# Patient Record
Sex: Female | Born: 1965 | Race: White | Hispanic: No | Marital: Single | Smoking: Never smoker
Health system: Southern US, Community
[De-identification: ages and names within clinical notes are randomized; demographics above are authoritative.]

## PROBLEM LIST (undated history)

## (undated) DIAGNOSIS — N289 Disorder of kidney and ureter, unspecified: Secondary | ICD-10-CM

## (undated) DIAGNOSIS — E785 Hyperlipidemia, unspecified: Secondary | ICD-10-CM

## (undated) DIAGNOSIS — F259 Schizoaffective disorder, unspecified: Secondary | ICD-10-CM

## (undated) DIAGNOSIS — E039 Hypothyroidism, unspecified: Secondary | ICD-10-CM

## (undated) DIAGNOSIS — F250 Schizoaffective disorder, bipolar type: Secondary | ICD-10-CM

## (undated) DIAGNOSIS — E119 Type 2 diabetes mellitus without complications: Secondary | ICD-10-CM

## (undated) HISTORY — PX: COLONOSCOPY: SHX174

## (undated) HISTORY — PX: TUBAL LIGATION: SHX77

## (undated) HISTORY — PX: SURGERY OF LIP: SUR1315

---

## 2018-03-08 ENCOUNTER — Emergency Department (EMERGENCY_DEPARTMENT_HOSPITAL)
Admission: EM | Admit: 2018-03-08 | Discharge: 2018-03-10 | Disposition: A | Discharge status: Other Acute Inpatient Hospital | Payer: Medicare Other | Source: Home / Self Care | Attending: Emergency Medicine | Admitting: Emergency Medicine

## 2018-03-08 ENCOUNTER — Encounter: Payer: Self-pay | Admitting: Emergency Medicine

## 2018-03-08 ENCOUNTER — Other Ambulatory Visit: Payer: Self-pay

## 2018-03-08 DIAGNOSIS — R451 Restlessness and agitation: Secondary | ICD-10-CM | POA: Insufficient documentation

## 2018-03-08 DIAGNOSIS — F259 Schizoaffective disorder, unspecified: Secondary | ICD-10-CM

## 2018-03-08 DIAGNOSIS — Z9114 Patient's other noncompliance with medication regimen: Secondary | ICD-10-CM

## 2018-03-08 DIAGNOSIS — F329 Major depressive disorder, single episode, unspecified: Secondary | ICD-10-CM | POA: Insufficient documentation

## 2018-03-08 DIAGNOSIS — F918 Other conduct disorders: Secondary | ICD-10-CM

## 2018-03-08 DIAGNOSIS — F250 Schizoaffective disorder, bipolar type: Secondary | ICD-10-CM | POA: Diagnosis not present

## 2018-03-08 DIAGNOSIS — E039 Hypothyroidism, unspecified: Secondary | ICD-10-CM

## 2018-03-08 DIAGNOSIS — Z79899 Other long term (current) drug therapy: Secondary | ICD-10-CM

## 2018-03-08 DIAGNOSIS — Z7982 Long term (current) use of aspirin: Secondary | ICD-10-CM

## 2018-03-08 DIAGNOSIS — Z7984 Long term (current) use of oral hypoglycemic drugs: Secondary | ICD-10-CM | POA: Insufficient documentation

## 2018-03-08 DIAGNOSIS — Z046 Encounter for general psychiatric examination, requested by authority: Secondary | ICD-10-CM | POA: Insufficient documentation

## 2018-03-08 DIAGNOSIS — E119 Type 2 diabetes mellitus without complications: Secondary | ICD-10-CM | POA: Insufficient documentation

## 2018-03-08 HISTORY — DX: Schizoaffective disorder, bipolar type: F25.0

## 2018-03-08 HISTORY — DX: Hypothyroidism, unspecified: E03.9

## 2018-03-08 HISTORY — DX: Type 2 diabetes mellitus without complications: E11.9

## 2018-03-08 HISTORY — DX: Disorder of kidney and ureter, unspecified: N28.9

## 2018-03-08 HISTORY — DX: Hyperlipidemia, unspecified: E78.5

## 2018-03-08 HISTORY — DX: Schizoaffective disorder, unspecified: F25.9

## 2018-03-08 LAB — COMPREHENSIVE METABOLIC PANEL
ALK PHOS: 44 U/L (ref 38–126)
ALT: 18 U/L (ref 0–44)
AST: 21 U/L (ref 15–41)
Albumin: 4.2 g/dL (ref 3.5–5.0)
Anion gap: 7 (ref 5–15)
BILIRUBIN TOTAL: 0.5 mg/dL (ref 0.3–1.2)
BUN: 19 mg/dL (ref 6–20)
CO2: 27 mmol/L (ref 22–32)
CREATININE: 1.79 mg/dL — AB (ref 0.44–1.00)
Calcium: 9.6 mg/dL (ref 8.9–10.3)
Chloride: 105 mmol/L (ref 98–111)
GFR calc Af Amer: 37 mL/min — ABNORMAL LOW (ref >60)
GFR, EST NON AFRICAN AMERICAN: 32 mL/min — AB (ref >60)
Glucose, Bld: 185 mg/dL — ABNORMAL HIGH (ref 70–99)
Potassium: 3.6 mmol/L (ref 3.5–5.1)
Sodium: 139 mmol/L (ref 135–145)
Total Protein: 7.4 g/dL (ref 6.5–8.1)

## 2018-03-08 LAB — CBC
HEMATOCRIT: 42.7 % (ref 35.0–47.0)
Hemoglobin: 14.7 g/dL (ref 12.0–16.0)
MCH: 31.3 pg (ref 26.0–34.0)
MCHC: 34.3 g/dL (ref 32.0–36.0)
MCV: 91.2 fL (ref 80.0–100.0)
Platelets: 242 10*3/uL (ref 150–440)
RBC: 4.68 MIL/uL (ref 3.80–5.20)
RDW: 12.8 % (ref 11.5–14.5)
WBC: 7.5 10*3/uL (ref 3.6–11.0)

## 2018-03-08 LAB — URINE DRUG SCREEN, QUALITATIVE (ARMC ONLY)
Amphetamines, Ur Screen: NOT DETECTED
BARBITURATES, UR SCREEN: NOT DETECTED
BENZODIAZEPINE, UR SCRN: NOT DETECTED
Cannabinoid 50 Ng, Ur ~~LOC~~: NOT DETECTED
Cocaine Metabolite,Ur ~~LOC~~: NOT DETECTED
MDMA (Ecstasy)Ur Screen: NOT DETECTED
METHADONE SCREEN, URINE: NOT DETECTED
OPIATE, UR SCREEN: NOT DETECTED
Phencyclidine (PCP) Ur S: NOT DETECTED
TRICYCLIC, UR SCREEN: NOT DETECTED

## 2018-03-08 LAB — ETHANOL

## 2018-03-08 LAB — GLUCOSE, CAPILLARY
Glucose-Capillary: 146 mg/dL — ABNORMAL HIGH (ref 70–99)
Glucose-Capillary: 201 mg/dL — ABNORMAL HIGH (ref 70–99)

## 2018-03-08 MED ORDER — LEVOTHYROXINE SODIUM 25 MCG PO TABS
ORAL_TABLET | ORAL | Status: AC
  Filled 2018-03-08: qty 1

## 2018-03-08 MED ORDER — LORAZEPAM 0.5 MG PO TABS: 0.5000 mg | ORAL_TABLET | Freq: Three times a day (TID) | ORAL | Status: DC | PRN

## 2018-03-08 MED ORDER — GLIPIZIDE 5 MG PO TABS
2.5000 mg | ORAL_TABLET | Freq: Every day | ORAL | Status: DC
  Administered 2018-03-09 – 2018-03-10 (×2): 2.5 mg via ORAL
  Filled 2018-03-08 (×4): qty 0.5

## 2018-03-08 MED ORDER — HALOPERIDOL 0.5 MG PO TABS
0.5000 mg | ORAL_TABLET | Freq: Three times a day (TID) | ORAL | Status: DC | PRN
  Filled 2018-03-08: qty 1

## 2018-03-08 MED ORDER — LAMOTRIGINE 100 MG PO TABS
200.0000 mg | ORAL_TABLET | Freq: Every day | ORAL | Status: DC
  Administered 2018-03-08 – 2018-03-10 (×3): 200 mg via ORAL
  Filled 2018-03-08 (×4): qty 2

## 2018-03-08 MED ORDER — FUROSEMIDE 40 MG PO TABS
40.0000 mg | ORAL_TABLET | Freq: Every day | ORAL | Status: DC
  Administered 2018-03-08 – 2018-03-10 (×3): 40 mg via ORAL
  Filled 2018-03-08 (×4): qty 1

## 2018-03-08 MED ORDER — ARIPIPRAZOLE 5 MG PO TABS
ORAL_TABLET | ORAL | Status: AC
  Filled 2018-03-08: qty 1

## 2018-03-08 MED ORDER — ASPIRIN 81 MG PO CHEW
81.0000 mg | CHEWABLE_TABLET | Freq: Every day | ORAL | Status: DC
  Administered 2018-03-08 – 2018-03-10 (×3): 81 mg via ORAL
  Filled 2018-03-08 (×4): qty 1

## 2018-03-08 MED ORDER — VITAMIN D3 25 MCG (1000 UNIT) PO TABS
1000.0000 [IU] | ORAL_TABLET | Freq: Every day | ORAL | Status: DC
  Administered 2018-03-09 – 2018-03-10 (×2): 1000 [IU] via ORAL
  Filled 2018-03-08 (×5): qty 1

## 2018-03-08 MED ORDER — ARIPIPRAZOLE 10 MG PO TABS
ORAL_TABLET | ORAL | Status: AC
  Filled 2018-03-08: qty 1

## 2018-03-08 MED ORDER — ATORVASTATIN CALCIUM 10 MG PO TABS
10.0000 mg | ORAL_TABLET | Freq: Every day | ORAL | Status: DC
  Administered 2018-03-08 – 2018-03-10 (×3): 10 mg via ORAL
  Filled 2018-03-08 (×3): qty 1

## 2018-03-08 MED ORDER — LAMOTRIGINE 100 MG PO TABS
ORAL_TABLET | ORAL | Status: AC
  Filled 2018-03-08: qty 1

## 2018-03-08 MED ORDER — ARIPIPRAZOLE 5 MG PO TABS
15.0000 mg | ORAL_TABLET | Freq: Every day | ORAL | Status: DC
  Administered 2018-03-09 – 2018-03-10 (×2): 15 mg via ORAL
  Filled 2018-03-08 (×2): qty 1

## 2018-03-08 MED ORDER — LEVOTHYROXINE SODIUM 25 MCG PO TABS
25.0000 ug | ORAL_TABLET | Freq: Every day | ORAL | Status: DC
  Administered 2018-03-09 – 2018-03-10 (×2): 25 ug via ORAL
  Filled 2018-03-08 (×2): qty 1

## 2018-03-08 MED ORDER — SODIUM CHLORIDE 0.9 % IV BOLUS
1000.0000 mL | Freq: Once | INTRAVENOUS | Status: AC
  Administered 2018-03-08: 1000 mL via INTRAVENOUS

## 2018-03-08 MED ORDER — GABAPENTIN 300 MG PO CAPS
600.0000 mg | ORAL_CAPSULE | Freq: Two times a day (BID) | ORAL | Status: DC
  Administered 2018-03-08 – 2018-03-10 (×5): 600 mg via ORAL
  Filled 2018-03-08 (×5): qty 2

## 2018-03-08 MED ORDER — INSULIN GLARGINE 100 UNIT/ML ~~LOC~~ SOLN
37.0000 [IU] | Freq: Every day | SUBCUTANEOUS | Status: DC
  Administered 2018-03-08 – 2018-03-09 (×2): 37 [IU] via SUBCUTANEOUS
  Filled 2018-03-08 (×3): qty 0.37

## 2018-03-08 MED ORDER — VITAMIN D 1000 UNITS PO TABS
ORAL_TABLET | ORAL | Status: AC
  Administered 2018-03-09: 1000 [IU]
  Filled 2018-03-08: qty 1

## 2018-03-08 NOTE — Consult Note (Signed)
[place] Psychiatry Consult   Reason for Consult: Consult for this 52 year old woman reportedly with a history of schizoaffective disorder who was brought here voluntarily from her group home Referring Physician: Quentin Cornwall Patient Identification: Stephanie Nicholson MRN:  [id number] Principal Diagnosis: Schizo affective schizophrenia [facility]) Diagnosis:   Patient Active Problem List   Diagnosis Date Noted  . Schizo affective schizophrenia ([place]) [F25.0] 03/08/2018  . Diabetes mellitus without complication ([place]) [Q24.4] 03/08/2018  . Agitation [R45.1] 03/08/2018    Total Time spent with patient: 1 hour  Subjective:   Stephanie Nicholson is a 52 y.o. female patient admitted with "they were getting up in my face".  HPI: Patient interviewed chart reviewed.  No information was brought over with the patient as far as I can tell.  Patient tells [place] that she was recently at [facility] in the hospital but there is no documentation in the chart to support that.  May have been at a different hospital.  Patient with what appears to probably be chronic mental illness however seems to have been discharged from a psychiatric hospital to a new group home where she has been residing for the last few weeks.  It sounds like she has had some verbal altercations with the staff there a lot of which seems to resist involve around her insistence that she has a boyfriend somewhere in another part of the state and that she wants to go live with him or call him on the phone frequently.  Apparently today there was a verbal altercation in which the patient was accused of refusing her medicine.  She does admit to me that she had not showered in about 3 days.  She says that the staff were screaming at her.  She denies any suicidal or homicidal thoughts.  Does not admit to any current hallucinations.  Claims that she has been compliant with her medicine.  Patient's thoughts are still disorganized and she is pressured and it is a  little bit difficult to redirect her.  However she does not say anything that is obviously delusional.  Not agitated or threatening with [place].  Labs are unremarkable except for her elevated blood sugar and her drug screen is all negative.  Social history: There is a secondhand report I am told that guardianship proceedings are underway.  The patient does not understand the situation very well.  She is not sure whether she already has a guardian appointed or not.  It sounds like her sister might of been the person who is most involved in looking out for her.  Patient has no children of her own.  Medical history: Diabetes insulin-dependent dyslipidemia, hypothyroidism  Substance abuse history: The patient denies any alcohol current or past and denies any recent drugs.".  Examination I think she is denying having any past drug problem as well.  Past Psychiatric History: Patient states she started having mental health hospitalizations in her 65s.  She counts a total of 4 of them is unclear to me if she may be counting this stay in the emergency room is 1 of them.  Apparently she was recently in a psychiatric hospital until a few weeks ago.  She says her diagnosis is bipolar disorder.  Schizoaffective disorder is popping up on the chart as an old diagnosis.  Patient is unable to name all of her medicines.  She denies any history of suicide attempts or violence.  Risk to Self:   Risk to Others:   Prior Inpatient Therapy:  Prior Outpatient Therapy:    Past Medical History:  Past Medical History:  Diagnosis Date  . Diabetes mellitus without complication ([place])   . Hyperlipemia   . Hypothyroidism   . Renal disorder   . Schizo affective schizophrenia ([place])    History reviewed. No pertinent surgical history. Family History: No family history on file. Family Psychiatric  History: Does not know of any Social History:  Social History   Substance and Sexual Activity  Alcohol Use Not Currently      Social History   Substance and Sexual Activity  Drug Use Not Currently    Social History   Socioeconomic History  . Marital status: Single    Spouse name: Not on file  . Number of children: Not on file  . Years of education: Not on file  . Highest education level: Not on file  Occupational History  . Not on file  Social Needs  . Financial resource strain: Not on file  . Food insecurity:    Worry: Not on file    Inability: Not on file  . Transportation needs:    Medical: Not on file    Non-medical: Not on file  Tobacco Use  . Smoking status: Never Smoker  . Smokeless tobacco: Never Used  Substance and Sexual Activity  . Alcohol use: Not Currently  . Drug use: Not Currently  . Sexual activity: Not on file  Lifestyle  . Physical activity:    Days per week: Not on file    Minutes per session: Not on file  . Stress: Not on file  Relationships  . Social connections:    Talks on phone: Not on file    Gets together: Not on file    Attends religious service: Not on file    Active member of club or organization: Not on file    Attends meetings of clubs or organizations: Not on file    Relationship status: Not on file  Other Topics Concern  . Not on file  Social History Narrative  . Not on file   Additional Social History:    Allergies:  No Known Allergies  Labs:  Results for orders placed or performed during the hospital encounter of 03/08/18 (from the past 48 hour(s))  Comprehensive metabolic panel     Status: Abnormal   Collection Time: 03/08/18 10:58 AM  Result Value Ref Range   Sodium 139 135 - 145 mmol/L   Potassium 3.6 3.5 - 5.1 mmol/L   Chloride 105 98 - 111 mmol/L   CO2 27 22 - 32 mmol/L   Glucose, Bld 185 (H) 70 - 99 mg/dL   BUN 19 6 - 20 mg/dL   Creatinine, Ser 1.79 (H) 0.44 - 1.00 mg/dL   Calcium 9.6 8.9 - 10.3 mg/dL   Total Protein 7.4 6.5 - 8.1 g/dL   Albumin 4.2 3.5 - 5.0 g/dL   AST 21 15 - 41 U/L   ALT 18 0 - 44 U/L   Alkaline Phosphatase  44 38 - 126 U/L   Total Bilirubin 0.5 0.3 - 1.2 mg/dL   GFR calc non Af Amer 32 (L) >60 mL/min   GFR calc Af Amer 37 (L) >60 mL/min    Comment: (NOTE) The eGFR has been calculated using the CKD EPI equation. This calculation has not been validated in all clinical situations. eGFR's persistently <60 mL/min signify possible Chronic Kidney Disease.    Anion gap 7 5 - 15    Comment: Performed at [facility], [address] [place], [place] [address]  Ethanol     Status: None   Collection Time: 03/08/18 10:58 AM  Result Value Ref Range   Alcohol, Ethyl (B) <10 <10 mg/dL    Comment: (NOTE) Lowest detectable limit for serum alcohol is 10 mg/dL. For medical purposes only. Performed at [facility], [address] [place], [place] [address]   cbc     Status: None   Collection Time: 03/08/18 10:58 AM  Result Value Ref Range   WBC 7.5 3.6 - 11.0 K/uL   RBC 4.68 3.80 - 5.20 MIL/uL   Hemoglobin 14.7 12.0 - 16.0 g/dL   HCT 42.7 35.0 - 47.0 %   MCV 91.2 80.0 - 100.0 fL   MCH 31.3 26.0 - 34.0 pg   MCHC 34.3 32.0 - 36.0 g/dL   RDW 12.8 11.5 - 14.5 %   Platelets 242 150 - 440 K/uL    Comment: Performed at [facility], [address], [place], [place] [address]  Urine Drug Screen, Qualitative     Status: None   Collection Time: 03/08/18 10:58 AM  Result Value Ref Range   Tricyclic, Ur Screen NONE DETECTED NONE DETECTED   Amphetamines, Ur Screen NONE DETECTED NONE DETECTED   MDMA (Ecstasy)Ur Screen NONE DETECTED NONE DETECTED   Cocaine Metabolite,Ur [place] NONE DETECTED NONE DETECTED   Opiate, Ur Screen NONE DETECTED NONE DETECTED   Phencyclidine (PCP) Ur S NONE DETECTED NONE DETECTED   Cannabinoid 50 Ng, Ur [place] NONE DETECTED NONE DETECTED   Barbiturates, Ur Screen NONE DETECTED NONE DETECTED   Benzodiazepine, Ur Scrn NONE DETECTED NONE DETECTED   Methadone Scn, Ur NONE DETECTED NONE DETECTED    Comment: (NOTE) Tricyclics + metabolites, urine    Cutoff 1000  ng/mL Amphetamines + metabolites, urine  Cutoff 1000 ng/mL MDMA (Ecstasy), urine              Cutoff 500 ng/mL Cocaine Metabolite, urine          Cutoff 300 ng/mL Opiate + metabolites, urine        Cutoff 300 ng/mL Phencyclidine (PCP), urine         Cutoff 25 ng/mL Cannabinoid, urine                 Cutoff 50 ng/mL Barbiturates + metabolites, urine  Cutoff 200 ng/mL Benzodiazepine, urine              Cutoff 200 ng/mL Methadone, urine                   Cutoff 300 ng/mL The urine drug screen provides only a preliminary, unconfirmed analytical test result and should not be used for non-medical purposes. Clinical consideration and professional judgment should be applied to any positive drug screen result due to possible interfering substances. A more specific alternate chemical method must be used in order to obtain a confirmed analytical result. Gas chromatography / mass spectrometry (GC/MS) is the preferred confirmat ory method. Performed at [facility], [address] [place], [place] [address]   Glucose, capillary     Status: Abnormal   Collection Time: 03/08/18 11:03 AM  Result Value Ref Range   Glucose-Capillary 146 (H) 70 - 99 mg/dL    Current Facility-Administered Medications  Medication Dose Route Frequency Provider Last Rate Last Dose  . ARIPiprazole (ABILIFY) tablet 15 mg  15 mg Oral Daily Drenda Freeze, MD      . aspirin chewable tablet  81 mg  81 mg Oral Daily Drenda Freeze, MD   81 mg at 03/08/18 1545  . atorvastatin (LIPITOR) tablet 10 mg  10 mg Oral Daily Drenda Freeze, MD   10 mg at 03/08/18 1545  . cholecalciferol (VITAMIN D) tablet 1,000 Units  1,000 Units Oral Daily Drenda Freeze, MD      . furosemide (LASIX) tablet 40 mg  40 mg Oral Daily Drenda Freeze, MD   40 mg at 03/08/18 1545  . gabapentin (NEURONTIN) capsule 600 mg  600 mg Oral BID Drenda Freeze, MD      . Derrill Memo ON 03/09/2018] glipiZIDE (GLUCOTROL) tablet 2.5 mg  2.5  mg Oral QAC breakfast Drenda Freeze, MD      . haloperidol (HALDOL) tablet 0.5 mg  0.5 mg Oral TID PRN Drenda Freeze, MD      . insulin glargine (LANTUS) injection 37 Units  37 Units Subcutaneous QHS Drenda Freeze, MD      . lamoTRIgine (LAMICTAL) tablet 200 mg  200 mg Oral Daily Drenda Freeze, MD   200 mg at 03/08/18 1545  . [START ON 03/09/2018] levothyroxine (SYNTHROID, LEVOTHROID) tablet 25 mcg  25 mcg Oral QAC breakfast Drenda Freeze, MD      . LORazepam (ATIVAN) tablet 0.5 mg  0.5 mg Oral TID PRN Drenda Freeze, MD       Current Outpatient Medications  Medication Sig Dispense Refill  . ARIPiprazole (ABILIFY) 15 MG tablet Take 15 mg by mouth daily.    Marland Kitchen aspirin 81 MG chewable tablet Chew 81 mg by mouth daily.    Marland Kitchen atorvastatin (LIPITOR) 10 MG tablet Take 10 mg by mouth daily.    . cholecalciferol (VITAMIN D) 1000 units tablet Take 1,000 Units by mouth daily.    . fenofibrate (TRICOR) 48 MG tablet Take 48 mg by mouth daily.    . furosemide (LASIX) 40 MG tablet Take 40 mg by mouth daily.    Marland Kitchen gabapentin (NEURONTIN) 300 MG capsule Take 600 mg by mouth 2 (two) times daily.    Marland Kitchen glipiZIDE (GLUCOTROL) 5 MG tablet Take 2.5 mg by mouth daily before breakfast.    . haloperidol (HALDOL) 1 MG tablet Take 0.5 mg by mouth 3 (three) times daily as needed for agitation.    . insulin glargine (LANTUS) 100 UNIT/ML injection Inject 37 Units into the skin at bedtime.    . lamoTRIgine (LAMICTAL) 200 MG tablet Take 200 mg by mouth daily.    Marland Kitchen levothyroxine (SYNTHROID, LEVOTHROID) 25 MCG tablet Take 25 mcg by mouth daily before breakfast.    . LORazepam (ATIVAN) 0.5 MG tablet Take 0.5 mg by mouth 3 (three) times daily as needed for anxiety.      Musculoskeletal: Strength & Muscle Tone: within normal limits Gait & Station: normal Patient leans: N/A  Psychiatric Specialty Exam: Physical Exam  Nursing note and vitals reviewed. Constitutional: She appears well-developed and  well-nourished.  HENT:  Head: Normocephalic and atraumatic.  Eyes: Pupils are equal, round, and reactive to light. Conjunctivae are normal.  Neck: Normal range of motion.  Cardiovascular: Regular rhythm and normal heart sounds.  Respiratory: Effort normal. No respiratory distress.  GI: Soft.  Musculoskeletal: Normal range of motion.  Neurological: She is alert.  Skin: Skin is warm and dry.  Psychiatric: Her affect is labile. Her speech is rapid and/or pressured and tangential. She is agitated. She is not aggressive. Thought content is not paranoid. Cognition and memory  are impaired. She expresses impulsivity. She expresses no homicidal and no suicidal ideation.    Review of Systems  Constitutional: Negative.   HENT: Negative.   Eyes: Negative.   Respiratory: Negative.   Cardiovascular: Negative.   Gastrointestinal: Negative.   Musculoskeletal: Negative.   Skin: Negative.   Neurological: Negative.   Psychiatric/Behavioral: Positive for depression. Negative for hallucinations, memory loss, substance abuse and suicidal ideas. The patient is nervous/anxious. The patient does not have insomnia.     Blood pressure 121/75, pulse 78, temperature 98 F (36.7 C), temperature source Oral, resp. rate 18, height 5' 6"  (1.676 m), weight 102.1 kg, SpO2 98 %.Body mass index is 36.32 kg/m.  General Appearance: Disheveled  Eye Contact:  Fair  Speech:  Pressured  Volume:  Increased  Mood:  Anxious  Affect:  Inappropriate and Labile  Thought Process:  Disorganized  Orientation:  Full (Time, Place, and Person)  Thought Content:  Rumination and Tangential  Suicidal Thoughts:  No  Homicidal Thoughts:  No  Memory:  Immediate;   Fair Recent;   Fair Remote;   Fair  Judgement:  Fair  Insight:  Shallow  Psychomotor Activity:  Decreased  Concentration:  Concentration: Poor  Recall:  [employer] of Knowledge:  Fair  Language:  Fair  Akathisia:  No  Handed:  Right  AIMS (if indicated):      Assets:  Desire for Improvement Housing Social Support  ADL's:  Impaired  Cognition:  Impaired,  Mild  Sleep:        Treatment Plan Summary: Plan This is a 52 year old woman who appears to have chronic mental health problems.  She is not the best historian and we do not have any other written information to go on but it sounds like she has a history of chronic psychotic possibly bipolar or schizoaffective bipolar disorder.  Currently she is presenting with some thought disorganization pressured speech and limitations in her insight but she is completely denying any thoughts of hurting anyone else or any suicidality.  Patient is not under IVC and given that she has a group home where she is living and that she agrees to go back there and is not making threatening statements she does not meet commitment criteria.  Tried to explain to the patient the situation she is in and gave her best recommendations about trying to stay calm and not escalate the situation.  Case reviewed with TTS and emergency room doctor.  She can be discharged back to her group home.  Disposition: No evidence of imminent risk to self or others at present.   Patient does not meet criteria for psychiatric inpatient admission. Supportive therapy provided about ongoing stressors. Discussed crisis plan, support from social network, calling 911, coming to the Emergency Department, and calling Suicide Hotline.  Alethia Berthold, MD 03/08/2018 3:49 PM

## 2018-03-08 NOTE — ED Notes (Signed)
Group home called at this time to fax over [facility]

## 2018-03-08 NOTE — ED Notes (Signed)
Patient asleep in room. No noted distress or abnormal behavior. Will continue 15 minute checks and observation by security cameras for safety. 

## 2018-03-08 NOTE — ED Provider Notes (Signed)
The patient has been evaluated at bedside by Dr. Toni Amend, psychiatry.  Patient is clinically stable.  Not felt to be a danger to self or others.  No SI or Hi.  No indication for inpatient psychiatric admission at this time.  Appropriate for continued outpatient therapy.    Willy Eddy, MD 03/08/18 1534

## 2018-03-08 NOTE — Discharge Instructions (Addendum)
Follow up with PCP

## 2018-03-08 NOTE — ED Notes (Signed)
PT  SEEN  BY  DR  CLAPACS  PT MOVED  TO  [facility]

## 2018-03-08 NOTE — ED Notes (Signed)
PT reports she wants to take all her daily medications in the morning. At first pt wanted to take them tonight but when pt took medications into room pt reports feeling sleepy and verbalized she would rather wait until the morning. Lights dimmed and pt resting for the evening. Calm and NAD noted at this time.

## 2018-03-08 NOTE — ED Provider Notes (Signed)
[facility] EMERGENCY DEPARTMENT Provider Note   CSN: [id number] Arrival date & time: 03/08/18  1028     History   Chief Complaint Chief Complaint  Patient presents with  . Psychiatric Evaluation    HPI Stephanie Nicholson is a 52 y.o. female history of diabetes, hypertension, schizoaffective disorder here presenting with agitation, refusing meds.  Patient is from a group home currently.  Patient was admitted to [facility] about 3 weeks ago for aggressive behavior and psychosis.  For the last several weeks, she has been aggressive with other clients at the group home.  She is also refusing her meds and has been aggressive with staff members. In particular, she refuses her diabetes meds and has been eating candy. Also several times, patient has been trying to walk out of the group home.  Patient has multiple previous psych admissions.  The staff member but she has no previous visits in our system. Patient tells me that he has a friend, Rande Lawman, that she wants to see. She states that he is her boyfriend and visits her but per staff member, she has no boyfriend. She denies any visual or auditory hallucinations.   The history is provided by the patient.    Past Medical History:  Diagnosis Date  . Diabetes mellitus without complication ([facility])   . Hyperlipemia   . Hypothyroidism   . Renal disorder   . Schizo affective schizophrenia ([facility])     There are no active problems to display for this patient.   History reviewed. No pertinent surgical history.   OB History   None      Home Medications    Prior to Admission medications   Medication Sig Start Date End Date Taking? Authorizing Provider  ARIPiprazole (ABILIFY) 15 MG tablet Take 15 mg by mouth daily.   Yes [provider]  aspirin 81 MG chewable tablet Chew 81 mg by mouth daily.   Yes [provider]  atorvastatin (LIPITOR) 10 MG tablet Take 10 mg by mouth daily.   Yes [provider]  cholecalciferol (VITAMIN D) 1000 units tablet Take 1,000 Units by mouth daily.   Yes [provider]  fenofibrate (TRICOR) 48 MG tablet Take 48 mg by mouth daily.   Yes [provider]  furosemide (LASIX) 40 MG tablet Take 40 mg by mouth daily.   Yes [provider]  gabapentin (NEURONTIN) 300 MG capsule Take 600 mg by mouth 2 (two) times daily.   Yes [provider]  glipiZIDE (GLUCOTROL) 5 MG tablet Take 2.5 mg by mouth daily before breakfast.   Yes [provider]  haloperidol (HALDOL) 1 MG tablet Take 0.5 mg by mouth 3 (three) times daily as needed for agitation.   Yes [provider]  insulin glargine (LANTUS) 100 UNIT/ML injection Inject 37 Units into the skin at bedtime.   Yes [provider]  lamoTRIgine (LAMICTAL) 200 MG tablet Take 200 mg by mouth daily.   Yes [provider]  levothyroxine (SYNTHROID, LEVOTHROID) 25 MCG tablet Take 25 mcg by mouth daily before breakfast.   Yes [provider]  LORazepam (ATIVAN) 0.5 MG tablet Take 0.5 mg by mouth 3 (three) times daily as needed for anxiety.   Yes [provider]    Family History No family history on file.  Social History Social History   Tobacco Use  . Smoking status: Never Smoker  . Smokeless tobacco: Never Used  Substance Use Topics  . Alcohol use:  Not Currently  . Drug use: Not Currently     Allergies   Patient has no known allergies.   Review of Systems Review of Systems  Psychiatric/Behavioral: Positive for hallucinations.  All other systems reviewed and are negative.    Physical Exam Updated Vital Signs BP 121/75 (BP Location: Left Arm)   Pulse 78   Temp 98 F (36.7 C) (Oral)   Resp 18   Ht 5\' 6"  (1.676 m)   Wt 102.1 kg   SpO2 98%   BMI 36.32 kg/m   Physical Exam  Constitutional: She is oriented to person, place, and time.  Slightly agitated   HENT:  Head: Normocephalic.  Eyes: Pupils are equal,  round, and reactive to light. Conjunctivae and EOM are normal.  Neck: Normal range of motion. Neck supple.  Cardiovascular: Normal rate, regular rhythm and normal heart sounds.  Pulmonary/Chest: Effort normal and breath sounds normal.  Abdominal: Soft. Bowel sounds are normal. She exhibits no distension. There is no tenderness.  Musculoskeletal: Normal range of motion.  Neurological: She is alert and oriented to person, place, and time.  Skin: Skin is warm.  Psychiatric:  Depressed, poor judgment   Nursing note and vitals reviewed.    ED Treatments / Results  Labs (all labs ordered are listed, but only abnormal results are displayed) Labs Reviewed  COMPREHENSIVE METABOLIC PANEL - Abnormal; Notable for the following components:      Result Value   Glucose, Bld 185 (*)    Creatinine, Ser 1.79 (*)    GFR calc non Af Amer 32 (*)    GFR calc Af Amer 37 (*)    All other components within normal limits  GLUCOSE, CAPILLARY - Abnormal; Notable for the following components:   Glucose-Capillary 146 (*)    All other components within normal limits  ETHANOL  CBC  URINE DRUG SCREEN, QUALITATIVE ([facility] ONLY)    EKG None  Radiology No results found.  Procedures Procedures (including critical care time)  Medications Ordered in ED Medications  ARIPiprazole (ABILIFY) tablet 15 mg (has no administration in time range)  aspirin chewable tablet 81 mg (has no administration in time range)  atorvastatin (LIPITOR) tablet 10 mg (has no administration in time range)  cholecalciferol (VITAMIN D) tablet 1,000 Units (has no administration in time range)  furosemide (LASIX) tablet 40 mg (has no administration in time range)  glipiZIDE (GLUCOTROL) tablet 2.5 mg (has no administration in time range)  haloperidol (HALDOL) tablet 0.5 mg (has no administration in time range)  gabapentin (NEURONTIN) capsule 600 mg (has no administration in time range)  insulin glargine (LANTUS) injection 37 Units (has  no administration in time range)  lamoTRIgine (LAMICTAL) tablet 200 mg (has no administration in time range)  levothyroxine (SYNTHROID, LEVOTHROID) tablet 25 mcg (has no administration in time range)  LORazepam (ATIVAN) tablet 0.5 mg (has no administration in time range)  sodium chloride 0.9 % bolus 1,000 mL (0 mLs Intravenous Stopped 03/08/18 1309)     Initial Impression / Assessment and Plan / ED Course  I have reviewed the triage vital signs and the nursing notes.  Pertinent labs & imaging results that were available during my care of the patient were reviewed by me and considered in my medical decision making (see chart for details).     Stephanie Nicholson is a 52 y.o. female here with depression, aggressive behavior. Patient was at [facility] recently for similar symptoms and had previous psych admission. CBG is 146 currently. Will get psych  clearance labs, TTS and psych consult.   2:57 PM Labs showed Cr 1.7, no baseline. UDS and ETOH neg. Given 1 L NS bolus. Started on home meds. Medically cleared, dispo pending psych eval.    Final Clinical Impressions(s) / ED Diagnoses   Final diagnoses:  None    ED Discharge Orders    None       Charlynne Pander, MD 03/08/18 1458

## 2018-03-08 NOTE — ED Notes (Signed)
Pt eating lunch tray at this time, NAD noted  

## 2018-03-08 NOTE — ED Notes (Signed)
VOL/Plan to watch then D/C back to [facility]

## 2018-03-08 NOTE — ED Notes (Signed)
1600 pm Bonita at [facility] called to inform her of patient discharge stated that they could not pick patient up from hospital to call her sister [name] at [phone].  1615 pm Patient sister [name] called and she refused to pick patient up from hospital stated "I don't feel she has had a good evaluation with what she is doing at her group home and they will not take her back."  1630 pm Call Peckham at [phone] with [facility] and she stated "No we will not take patient back." She also stated that patient has a guardian who is her sister [name] and a guardian ad litem Martin Majestic [phone] phone call place to guardian ad litem and no answer. Social worker Minerva Areola Antherhans CSW informed of patient group home [facility] refusing to take patient back stated he would follow up with group home. Patient to transfer to [facility] until discharged.

## 2018-03-08 NOTE — ED Triage Notes (Addendum)
Pt to ED via POV with group home ([facility] caretaker, per caretaker pt was just admitted into their care from [facility] and pt has been having outburst and trying to harm herself by refusing all meds per staff. Pt hx of diabetes and refuses meds xcouple days. Pt A&O to self. VSS . Pt denies SI/HI. Psych hx

## 2018-03-08 NOTE — ED Notes (Signed)
PT. Has green shirt, black sports bra, black pants, blue panties, black/pink shoes, green purse, black notebook, debit card,and cell phone.

## 2018-03-08 NOTE — ED Notes (Signed)
Report received from Shannon RN. Patient care assumed. Patient/RN introduction complete. Pt laying in bed at this time with no distress noted.   

## 2018-03-08 NOTE — ED Notes (Signed)
Update given to Sister at this time over the phone, pt has given this RN to speak with family about pt health concerns.

## 2018-03-08 NOTE — ED Notes (Signed)
Patient alert, oriented and verbal. Patient is calm and cooperative. Patient resting in bed with her eyes open. Q 15 minute checks in progress and patient remains safe on unit. Monitoring continues.

## 2018-03-08 NOTE — ED Notes (Signed)
Guardianship is being filed a this time but not yet complete

## 2018-03-08 NOTE — ED Notes (Signed)
Patient's sister called expressing concern about the patient's discharge. RN has informed Dr. Toni Amend.   Stephanie Nicholson  [phone]

## 2018-03-08 NOTE — ED Notes (Signed)
VOL/Plan to watch then D/C back to [facility]  

## 2018-03-08 NOTE — ED Notes (Signed)
PT  VOL  PENDING  CONSULT 

## 2018-03-08 NOTE — ED Notes (Signed)
Report call to Amy in [facility] patient transferred from [facility] to [facility]

## 2018-03-09 NOTE — ED Notes (Signed)
Patient observed with no unusual behavior or acute distress. Patient with no verbalized needs or c/o at this time.... will continue to monitor and follow up as needed. Security staff monitoring patient on [employer] system.  

## 2018-03-09 NOTE — ED Notes (Signed)
Report received from off-going RN.  This RN made initial assessment of patient.  Patient currently resting quietly in her room.  Patient denies any SI, HI, or auditory or visual hallucinations.  Patient in NAD at this time and remains cooperative and calm in her room. Patient denies any needs at this time and no distress noted with the patient at this time.

## 2018-03-09 NOTE — ED Notes (Signed)
Hourly rounding reveals patient sleeping in room. No complaints, stable, in no acute distress. Q15 minute rounds and monitoring via Security Cameras to continue. 

## 2018-03-09 NOTE — BH Assessment (Signed)
Assessment Note  Stephanie Nicholson is an 52 y.o. female who presents to the ER due to her care home having concerns about the patient's behaviors. Per information provided to the ER, the patient have been aggressive, refusing medications and eating large amounts of candy. Per the report of the patient, she's unsure why she was brought to the ER. She denies aggression towards staff at the care facility. She admits to eating large amounts of candy.  Patient have a history of mental illness and multiple hospitalizations due to it.  During the interview, the patient was calm, cooperative and pleasant. Majority of the answers were not appropriate for the questions. With this Clinical research associatewriter, patient denied SI/HI and AV/H.  Writer made attempts to contact the Care Facility ([facility] and [name]([name]-[phone]) was unable to reach anyone.   Diagnosis: Schizoaffective  Past Medical History:  Past Medical History:  Diagnosis Date  . Diabetes mellitus without complication (HCC)   . Hyperlipemia   . Hypothyroidism   . Renal disorder   . Schizo affective schizophrenia (HCC)     History reviewed. No pertinent surgical history.  Family History: No family history on file.  Social History:  reports that she has never smoked. She has never used smokeless tobacco. She reports that she drank alcohol. She reports that she has current or past drug history.  Additional Social History:  Alcohol / Drug Use Pain Medications: See PTA Prescriptions: See PTA Over the Counter: See PTA History of alcohol / drug use?: No history of alcohol / drug abuse Longest period of sobriety (when/how long): Reports of none(n/a) Negative Consequences of Use: (n/a) Withdrawal Symptoms: (n/a)  CIWA: CIWA-Ar BP: (!) 107/57 Pulse Rate: 63 COWS:    Allergies: No Known Allergies  Home Medications:  (Not in a hospital admission)  OB/GYN Status:  No LMP recorded. Patient is  postmenopausal.  General Assessment Data Location of Assessment: [facility] ED TTS Assessment: In system Is this a Tele or Face-to-Face Assessment?: Face-to-Face Is this an Initial Assessment or a Re-assessment for this encounter?: Initial Assessment Patient Accompanied by:: N/A(None) Language Other than English: No Living Arrangements: In Assisted Living/Nursing Home (Comment: Name of Nursing Home What gender do you identify as?: Female Marital status: Single Pregnancy Status: No Living Arrangements: Other (Comment)(Facility) Can pt return to current living arrangement?: Yes Admission Status: Voluntary Is patient capable of signing voluntary admission?: Yes Referral Source: Self/Family/Friend Insurance [facility]  Medical Screening Exam [facility] Walk-in ONLY) Medical Exam completed: Yes  Crisis Care Plan Living Arrangements: Other (Comment)(Facility) Legal Guardian: Other relative([name] (Sister-[phone])) Name of Psychiatrist: Unknown Name of Therapist: Unknown  Education Status Is patient currently in school?: No Is the patient employed, unemployed or receiving disability?: Unemployed, Receiving disability income  Risk to self with the past 6 months Suicidal Ideation: No Has patient been a risk to self within the past 6 months prior to admission? : No Suicidal Intent: No Has patient had any suicidal intent within the past 6 months prior to admission? : No Is patient at risk for suicide?: No Suicidal Plan?: No Has patient had any suicidal plan within the past 6 months prior to admission? : No Access to Means: No What has been your use of drugs/alcohol within the last 12 months?: Reports of none Previous Attempts/Gestures: No How many times?: 0 Other Self Harm Risks: Reports of none Triggers for Past Attempts: None known Intentional Self Injurious Behavior: None Family Suicide History: Unknown Recent stressful life  event(s): Other (Comment) Persecutory  voices/beliefs?: No Depression: No Depression Symptoms: Feeling angry/irritable Substance abuse history and/or treatment for substance abuse?: No Suicide prevention information given to non-admitted patients: Not applicable  Risk to Others within the past 6 months Homicidal Ideation: No Does patient have any lifetime risk of violence toward others beyond the six months prior to admission? : Yes (comment)(Aggression towards staff) Thoughts of Harm to Others: No Current Homicidal Intent: No Current Homicidal Plan: No Access to Homicidal Means: No Identified Victim: Reports of none History of harm to others?: No Assessment of Violence: In distant past Violent Behavior Description: Aggression towards staff at the facility Does patient have access to weapons?: No Criminal Charges Pending?: No Does patient have a court date: No Is patient on probation?: No  Psychosis Hallucinations: Auditory(History of it) Delusions: Unspecified  Mental Status Report Appearance/Hygiene: Unremarkable, In scrubs Eye Contact: Fair Motor Activity: Freedom of movement, Unremarkable Speech: Incoherent, Tangential Level of Consciousness: Alert Mood: Pleasant Affect: Appropriate to circumstance Anxiety Level: Minimal Thought Processes: Flight of Ideas, Coherent, Tangential Judgement: Partial Orientation: Person, Place, Time, Situation, Appropriate for developmental age Obsessive Compulsive Thoughts/Behaviors: Minimal  Cognitive Functioning Concentration: Normal Memory: Recent Intact, Remote Intact Is patient IDD: No Insight: Fair Impulse Control: Fair Appetite: Good Have you had any weight changes? : No Change Sleep: No Change Total Hours of Sleep: 8 Vegetative Symptoms: None  ADLScreening [facility] Assessment Services) Patient's cognitive ability adequate to safely complete daily activities?: Yes Patient able to express need for assistance with ADLs?: Yes Independently performs ADLs?: Yes  (appropriate for developmental age)  Prior Inpatient Therapy Prior Inpatient Therapy: Yes Prior Therapy Dates: Multiple hospitalizations Prior Therapy Facilty/Provider(s): Multiple hospitalizations Reason for Treatment: Schizophrenia  Prior Outpatient Therapy Prior Outpatient Therapy: No Does patient have an ACCT team?: No Does patient have Intensive In-House Services?  : No Does patient have [facility] services? : No Does patient have P4CC services?: No  ADL Screening (condition at time of admission) Patient's cognitive ability adequate to safely complete daily activities?: Yes Is the patient deaf or have difficulty hearing?: No Does the patient have difficulty seeing, even when wearing glasses/contacts?: No Does the patient have difficulty concentrating, remembering, or making decisions?: No Patient able to express need for assistance with ADLs?: Yes Does the patient have difficulty dressing or bathing?: No Independently performs ADLs?: Yes (appropriate for developmental age) Does the patient have difficulty walking or climbing stairs?: No Weakness of Legs: None Weakness of Arms/Hands: None  Home Assistive Devices/Equipment Home Assistive Devices/Equipment: None  Therapy Consults (therapy consults require a physician order) PT Evaluation Needed: No OT Evalulation Needed: No SLP Evaluation Needed: No Abuse/Neglect Assessment (Assessment to be complete while patient is alone) Abuse/Neglect Assessment Can Be Completed: Yes Physical Abuse: Denies Verbal Abuse: Denies Sexual Abuse: Denies Exploitation of patient/patient's resources: Denies Self-Neglect: Denies Values / Beliefs Cultural Requests During Hospitalization: None Spiritual Requests During Hospitalization: None Consults Spiritual Care Consult Needed: No Social Work Consult Needed: No Merchant navy officer (For Healthcare) Does Patient Have a Medical Advance Directive?: No Would patient like information on creating a  medical advance directive?: No - Patient declined       Child/Adolescent Assessment Running Away Risk: Denies(Patient is an adult)  Disposition:  Disposition Initial Assessment Completed for this Encounter: Yes Patient referred to: Other (Comment)(Return to facility)  On Site Evaluation by:   Reviewed with Physician:    Lilyan Gilford MS, LCAS, LPC, NCC, CCSI Therapeutic Triage Specialist 03/09/2018 12:39 PM

## 2018-03-09 NOTE — ED Notes (Signed)
Pt's sister called and requested to speak with patient.  Patient refused to talk to her sister.

## 2018-03-09 NOTE — ED Notes (Signed)
Pt observed crying in the dayroom.  Told nurse that she misses her boyfriend and that her sister doesn't want her to see her.  Asked patient if she would like to call her boyfriend and she stated that he doesn't have a phone. Pt stated, "Please don't give me any medicine.  I'm o.k. I'm just upset about my boyfriend."

## 2018-03-09 NOTE — Social Work (Signed)
CSW called [facility] [phone] and spoke to a staff person to inform her that patient has been psychiatrically cleared to return back to group home and she would have to be picked up.  Group home stated that CSW has to call patient's sister to pick her up from hospital and bring her back to group home.  CSW spoke to patient's sister Trenton GammonJo Rogers [phone] who is patient's legal guardian and she stated she does not feel safe transporting patient back to her group home.  CSW called group home back again and they told me to call the administrator Nada LibmanSherry Crisp [phone] about picking patient up and bringing her back to family care home.  CSW left a message awaiting for call back from administrator.  Ervin KnackEric R. Hassan Rowannterhaus, MSW, Theresia MajorsLCSWA [phone] 03/09/2018 5:39 PM

## 2018-03-09 NOTE — ED Provider Notes (Signed)
Vitals:   03/08/18 1432 03/08/18 2050  BP: 121/75 (!) 107/57  Pulse: 78 63  Resp: 18 18  Temp: 98 F (36.7 C) 98.2 F (36.8 C)  SpO2: 98% 98%    I reviewed Dr. Toni Amendlapacs note from yesterday - recommending discharge, will check with Behavioral Med to help with arranging back home.   Governor RooksLord, Albana Saperstein, MD 03/09/18 [phone]

## 2018-03-10 ENCOUNTER — Inpatient Hospital Stay
Admission: AD | Admit: 2018-03-10 | Discharge: 2018-03-15 | DRG: 885 | Disposition: A | Discharge status: Home / Self Care | Payer: Medicare Other | Source: Intra-hospital | Attending: Psychiatry | Admitting: Psychiatry

## 2018-03-10 ENCOUNTER — Other Ambulatory Visit: Payer: Self-pay

## 2018-03-10 ENCOUNTER — Encounter: Payer: Self-pay | Admitting: Psychiatry

## 2018-03-10 DIAGNOSIS — Z593 Problems related to living in residential institution: Secondary | ICD-10-CM | POA: Diagnosis not present

## 2018-03-10 DIAGNOSIS — F419 Anxiety disorder, unspecified: Secondary | ICD-10-CM | POA: Diagnosis present

## 2018-03-10 DIAGNOSIS — F250 Schizoaffective disorder, bipolar type: Secondary | ICD-10-CM | POA: Diagnosis present

## 2018-03-10 DIAGNOSIS — E785 Hyperlipidemia, unspecified: Secondary | ICD-10-CM | POA: Diagnosis present

## 2018-03-10 DIAGNOSIS — Z79890 Hormone replacement therapy (postmenopausal): Secondary | ICD-10-CM | POA: Diagnosis not present

## 2018-03-10 DIAGNOSIS — Z794 Long term (current) use of insulin: Secondary | ICD-10-CM | POA: Diagnosis not present

## 2018-03-10 DIAGNOSIS — F79 Unspecified intellectual disabilities: Secondary | ICD-10-CM | POA: Diagnosis present

## 2018-03-10 DIAGNOSIS — E119 Type 2 diabetes mellitus without complications: Secondary | ICD-10-CM

## 2018-03-10 DIAGNOSIS — N289 Disorder of kidney and ureter, unspecified: Secondary | ICD-10-CM | POA: Diagnosis present

## 2018-03-10 DIAGNOSIS — R451 Restlessness and agitation: Secondary | ICD-10-CM | POA: Diagnosis present

## 2018-03-10 DIAGNOSIS — E039 Hypothyroidism, unspecified: Secondary | ICD-10-CM | POA: Diagnosis present

## 2018-03-10 DIAGNOSIS — Z7982 Long term (current) use of aspirin: Secondary | ICD-10-CM | POA: Diagnosis not present

## 2018-03-10 DIAGNOSIS — Z87820 Personal history of traumatic brain injury: Secondary | ICD-10-CM | POA: Diagnosis not present

## 2018-03-10 DIAGNOSIS — R251 Tremor, unspecified: Secondary | ICD-10-CM | POA: Diagnosis present

## 2018-03-10 LAB — GLUCOSE, CAPILLARY: GLUCOSE-CAPILLARY: 215 mg/dL — AB (ref 70–99)

## 2018-03-10 MED ORDER — LEVOTHYROXINE SODIUM 50 MCG PO TABS
25.0000 ug | ORAL_TABLET | Freq: Every day | ORAL | Status: DC
  Administered 2018-03-11 – 2018-03-15 (×5): 25 ug via ORAL
  Filled 2018-03-10 (×5): qty 1

## 2018-03-10 MED ORDER — VITAMIN D 1000 UNITS PO TABS
1000.0000 [IU] | ORAL_TABLET | Freq: Every day | ORAL | Status: DC
  Administered 2018-03-11 – 2018-03-15 (×5): 1000 [IU] via ORAL
  Filled 2018-03-10 (×5): qty 1

## 2018-03-10 MED ORDER — LORAZEPAM 0.5 MG PO TABS: 0.5000 mg | ORAL_TABLET | Freq: Three times a day (TID) | ORAL | Status: DC | PRN

## 2018-03-10 MED ORDER — LAMOTRIGINE 100 MG PO TABS
200.0000 mg | ORAL_TABLET | Freq: Every day | ORAL | Status: DC
  Administered 2018-03-11 – 2018-03-15 (×5): 200 mg via ORAL
  Filled 2018-03-10 (×5): qty 2

## 2018-03-10 MED ORDER — FUROSEMIDE 20 MG PO TABS
40.0000 mg | ORAL_TABLET | Freq: Every day | ORAL | Status: DC
  Administered 2018-03-11 – 2018-03-15 (×5): 40 mg via ORAL
  Filled 2018-03-10 (×5): qty 2

## 2018-03-10 MED ORDER — INSULIN ASPART 100 UNIT/ML ~~LOC~~ SOLN
0.0000 [IU] | Freq: Three times a day (TID) | SUBCUTANEOUS | Status: DC
  Administered 2018-03-11 – 2018-03-13 (×6): 2 [IU] via SUBCUTANEOUS
  Administered 2018-03-13 (×2): 3 [IU] via SUBCUTANEOUS
  Administered 2018-03-14: 2 [IU] via SUBCUTANEOUS
  Administered 2018-03-14 – 2018-03-15 (×2): 3 [IU] via SUBCUTANEOUS
  Administered 2018-03-15 (×2): 2 [IU] via SUBCUTANEOUS
  Filled 2018-03-10 (×11): qty 1

## 2018-03-10 MED ORDER — ATORVASTATIN CALCIUM 20 MG PO TABS
10.0000 mg | ORAL_TABLET | Freq: Every day | ORAL | Status: DC
  Administered 2018-03-11 – 2018-03-14 (×4): 10 mg via ORAL
  Filled 2018-03-10 (×4): qty 1

## 2018-03-10 MED ORDER — INSULIN GLARGINE 100 UNIT/ML ~~LOC~~ SOLN
40.0000 [IU] | Freq: Every day | SUBCUTANEOUS | Status: DC
  Filled 2018-03-10 (×2): qty 0.4

## 2018-03-10 MED ORDER — ACETAMINOPHEN 325 MG PO TABS: 650.0000 mg | ORAL_TABLET | Freq: Four times a day (QID) | ORAL | Status: DC | PRN

## 2018-03-10 MED ORDER — INSULIN ASPART 100 UNIT/ML ~~LOC~~ SOLN: 0.0000 [IU] | Freq: Three times a day (TID) | SUBCUTANEOUS | Status: DC

## 2018-03-10 MED ORDER — ARIPIPRAZOLE 5 MG PO TABS
15.0000 mg | ORAL_TABLET | Freq: Every day | ORAL | Status: DC
  Administered 2018-03-11 – 2018-03-15 (×5): 15 mg via ORAL
  Filled 2018-03-10 (×5): qty 1

## 2018-03-10 MED ORDER — GABAPENTIN 600 MG PO TABS
ORAL_TABLET | ORAL | Status: AC
  Filled 2018-03-10: qty 1

## 2018-03-10 MED ORDER — ALUM & MAG HYDROXIDE-SIMETH 200-200-20 MG/5ML PO SUSP: 30.0000 mL | ORAL | Status: DC | PRN

## 2018-03-10 MED ORDER — GABAPENTIN 300 MG PO CAPS
600.0000 mg | ORAL_CAPSULE | Freq: Two times a day (BID) | ORAL | Status: DC
  Administered 2018-03-11 – 2018-03-15 (×10): 600 mg via ORAL
  Filled 2018-03-10 (×10): qty 2

## 2018-03-10 MED ORDER — MAGNESIUM HYDROXIDE 400 MG/5ML PO SUSP: 30.0000 mL | Freq: Every day | ORAL | Status: DC | PRN

## 2018-03-10 MED ORDER — HALOPERIDOL 0.5 MG PO TABS: 0.5000 mg | ORAL_TABLET | Freq: Three times a day (TID) | ORAL | Status: DC | PRN

## 2018-03-10 MED ORDER — ASPIRIN 81 MG PO CHEW
81.0000 mg | CHEWABLE_TABLET | Freq: Every day | ORAL | Status: DC
  Administered 2018-03-11 – 2018-03-15 (×5): 81 mg via ORAL
  Filled 2018-03-10 (×5): qty 1

## 2018-03-10 MED ORDER — INSULIN GLARGINE 100 UNIT/ML ~~LOC~~ SOLN
40.0000 [IU] | Freq: Every day | SUBCUTANEOUS | Status: DC
  Administered 2018-03-10: 40 [IU] via SUBCUTANEOUS
  Filled 2018-03-10: qty 0.4

## 2018-03-10 MED ORDER — GLIPIZIDE 5 MG PO TABS
2.5000 mg | ORAL_TABLET | Freq: Every day | ORAL | Status: DC
  Administered 2018-03-11: 09:00:00 via ORAL
  Administered 2018-03-12 – 2018-03-15 (×4): 2.5 mg via ORAL
  Filled 2018-03-10 (×6): qty 0.5

## 2018-03-10 MED ORDER — HYDROXYZINE HCL 50 MG PO TABS: 50.0000 mg | ORAL_TABLET | Freq: Three times a day (TID) | ORAL | Status: DC | PRN

## 2018-03-10 NOTE — ED Provider Notes (Signed)
-----------------------------------------   7:21 AM on 03/10/2018 -----------------------------------------   Blood pressure 134/80, pulse 74, temperature 98.1 F (36.7 C), temperature source Oral, resp. rate 18, height 5\' 6"  (1.676 m), weight 102.1 kg, SpO2 99 %.  The patient had no acute events since last update.  Calm and cooperative at this time.  Disposition is pending Psychiatry/Behavioral Medicine team recommendations.     Irean HongSung, Jade J, MD 03/10/18 [phone]

## 2018-03-10 NOTE — ED Notes (Signed)
Pt IVC'd by Dr. Toni Amendlapacs [facility]

## 2018-03-10 NOTE — ED Notes (Signed)
Hourly rounding reveals patient sleeping in room. No complaints, stable, in no acute distress. Q15 minute rounds and monitoring via Security Cameras to continue. 

## 2018-03-10 NOTE — Progress Notes (Addendum)
LCSW called  the administrator [facility]herry [facility] [phone]  and she reported she is working on finding a suitable placement for the patient. She became hostile and verbally abusive and yelled at this worker for 2 minutes ( my response to her agitation  Was yes mam. I repeated myself. LCSW was asked not to call the administrator back and that she would call me. I reviewed the legislation " 30 day written notice"  Firmly with administrator which lead to her verbal altercation LCSW was heard by ED charge nurse attempting to appease this administrator. Will await a call back.  Delta Air LinesClaudine Berlie Hatchel LCSW [phone]

## 2018-03-10 NOTE — Progress Notes (Signed)
This nurse called administrator, Nada LibmanSherry Crisp and left a voicemail requesting to know the status of patient returning to group home.  Awaiting a return phone call at this time.

## 2018-03-10 NOTE — BH Assessment (Signed)
Patient [facility] to [facility] by Dr. Toni Amendlapacs.  Attending Physician will be Dr. Jennet MaduroPucilowska.   Patient [facility].   ER staff is aware of the admission:  Glenda, ER Secretary    Dr. Fanny BienQuale, ER MD   Ethelene BrownsAnthony, Patient Access.

## 2018-03-10 NOTE — Consult Note (Signed)
[facility]   Reason for Consult: Follow-up consult for patient with schizoaffective disorder and agitation Referring Physician: Quale Patient Identification: Stephanie Nicholson MRN:  [id number] Principal Diagnosis: Schizo affective schizophrenia [facility](HCC) Diagnosis:   Patient Active Problem List   Diagnosis Date Noted  . Schizo affective schizophrenia (HCC) [F25.0] 03/08/2018  . Diabetes mellitus without complication (HCC) [E11.9] 03/08/2018  . Agitation [R45.1] 03/08/2018    Total Time spent with patient: 30 minutes  Subjective:   Stephanie Nicholson is a 52 y.o. female patient admitted with "I just want to die".  HPI: Patient interviewed chart reviewed.  Patient was seen yesterday and was to be discharged but group home did not come to pick her up and her guardian has resisted discharge.  In the following day she has decompensated.  Today she was agitated screaming crying off and on understandable.  Making statements about wanting to die.  Unable to engage in rational conversation.  Could not be considered for discharge at this point.  Instead seems more appropriate for admission for stabilization.  Past Psychiatric History: History of schizoaffective disorder with hospitalization and poor outpatient functioning  Risk to Self: Suicidal Ideation: No Suicidal Intent: No Is patient at risk for suicide?: No Suicidal Plan?: No Access to Means: No What has been your use of drugs/alcohol within the last 12 months?: Reports of none How many times?: 0 Other Self Harm Risks: Reports of none Triggers for Past Attempts: None known Intentional Self Injurious Behavior: None Risk to Others: Homicidal Ideation: No Thoughts of Harm to Others: No Current Homicidal Intent: No Current Homicidal Plan: No Access to Homicidal Means: No Identified Victim: Reports of none History of harm to others?: No Assessment of Violence: In distant past Violent Behavior Description: Aggression  towards staff at the facility Does patient have access to weapons?: No Criminal Charges Pending?: No Does patient have a court date: No Prior Inpatient Therapy: Prior Inpatient Therapy: Yes Prior Therapy Dates: Multiple hospitalizations Prior Therapy Facilty/Provider(s): Multiple hospitalizations Reason for Treatment: Schizophrenia Prior Outpatient Therapy: Prior Outpatient Therapy: No Does patient have an ACCT team?: No Does patient have Intensive In-House Services?  : No Does patient have [facility] services? : No Does patient have P4CC services?: No  Past Medical History:  Past Medical History:  Diagnosis Date  . Diabetes mellitus without complication (HCC)   . Hyperlipemia   . Hypothyroidism   . Renal disorder   . Schizo affective schizophrenia (HCC)    History reviewed. No pertinent surgical history. Family History: No family history on file. Family Psychiatric  History: See previous note Social History:  Social History   Substance and Sexual Activity  Alcohol Use Not Currently     Social History   Substance and Sexual Activity  Drug Use Not Currently    Social History   Socioeconomic History  . Marital status: Single    Spouse name: Not on file  . Number of children: Not on file  . Years of education: Not on file  . Highest education level: Not on file  Occupational History  . Not on file  Social Needs  . Financial resource strain: Not on file  . Food insecurity:    Worry: Not on file    Inability: Not on file  . Transportation needs:    Medical: Not on file    Non-medical: Not on file  Tobacco Use  . Smoking status: Never Smoker  . Smokeless tobacco: Never Used  Substance and Sexual Activity  .  Alcohol use: Not Currently  . Drug use: Not Currently  . Sexual activity: Not on file  Lifestyle  . Physical activity:    Days per week: Not on file    Minutes per session: Not on file  . Stress: Not on file  Relationships  . Social connections:    Talks  on phone: Not on file    Gets together: Not on file    Attends religious service: Not on file    Active member of club or organization: Not on file    Attends meetings of clubs or organizations: Not on file    Relationship status: Not on file  Other Topics Concern  . Not on file  Social History Narrative  . Not on file   Additional Social History:    Allergies:  No Known Allergies  Labs:  Results for orders placed or performed during the hospital encounter of 03/08/18 (from the past 48 hour(s))  Glucose, capillary     Status: Abnormal   Collection Time: 03/08/18  9:52 PM  Result Value Ref Range   Glucose-Capillary 201 (H) 70 - 99 mg/dL  Glucose, capillary     Status: Abnormal   Collection Time: 03/09/18 10:28 PM  Result Value Ref Range   Glucose-Capillary 215 (H) 70 - 99 mg/dL    Current Facility-Administered Medications  Medication Dose Route Frequency Provider Last Rate Last Dose  . ARIPiprazole (ABILIFY) tablet 15 mg  15 mg Oral Daily Charlynne Pander, MD   15 mg at 03/10/18 0844  . aspirin chewable tablet 81 mg  81 mg Oral Daily Charlynne Pander, MD   81 mg at 03/10/18 0848  . atorvastatin (LIPITOR) tablet 10 mg  10 mg Oral Daily Charlynne Pander, MD   10 mg at 03/10/18 0848  . cholecalciferol (VITAMIN D) tablet 1,000 Units  1,000 Units Oral Daily Charlynne Pander, MD   1,000 Units at 03/10/18 0848  . furosemide (LASIX) tablet 40 mg  40 mg Oral Daily Charlynne Pander, MD   40 mg at 03/10/18 0847  . gabapentin (NEURONTIN) capsule 600 mg  600 mg Oral BID Charlynne Pander, MD   600 mg at 03/10/18 0849  . glipiZIDE (GLUCOTROL) tablet 2.5 mg  2.5 mg Oral QAC breakfast Charlynne Pander, MD   2.5 mg at 03/10/18 0846  . haloperidol (HALDOL) tablet 0.5 mg  0.5 mg Oral TID PRN Charlynne Pander, MD      . insulin glargine (LANTUS) injection 37 Units  37 Units Subcutaneous QHS Charlynne Pander, MD   37 Units at 03/09/18 2231  . lamoTRIgine (LAMICTAL) tablet 200 mg   200 mg Oral Daily Charlynne Pander, MD   200 mg at 03/10/18 0851  . levothyroxine (SYNTHROID, LEVOTHROID) tablet 25 mcg  25 mcg Oral QAC breakfast Charlynne Pander, MD   25 mcg at 03/10/18 0850  . LORazepam (ATIVAN) tablet 0.5 mg  0.5 mg Oral TID PRN Charlynne Pander, MD       Current Outpatient Medications  Medication Sig Dispense Refill  . ARIPiprazole (ABILIFY) 15 MG tablet Take 15 mg by mouth daily.    Marland Kitchen aspirin 81 MG chewable tablet Chew 81 mg by mouth daily.    Marland Kitchen atorvastatin (LIPITOR) 10 MG tablet Take 10 mg by mouth daily.    . cholecalciferol (VITAMIN D) 1000 units tablet Take 1,000 Units by mouth daily.    . fenofibrate (TRICOR) 48 MG tablet Take 48 mg by  mouth daily.    . furosemide (LASIX) 40 MG tablet Take 40 mg by mouth daily.    Marland Kitchen gabapentin (NEURONTIN) 300 MG capsule Take 600 mg by mouth 2 (two) times daily.    Marland Kitchen glipiZIDE (GLUCOTROL) 5 MG tablet Take 2.5 mg by mouth daily before breakfast.    . haloperidol (HALDOL) 1 MG tablet Take 0.5 mg by mouth 3 (three) times daily as needed for agitation.    . insulin glargine (LANTUS) 100 UNIT/ML injection Inject 37 Units into the skin at bedtime.    . lamoTRIgine (LAMICTAL) 200 MG tablet Take 200 mg by mouth daily.    Marland Kitchen levothyroxine (SYNTHROID, LEVOTHROID) 25 MCG tablet Take 25 mcg by mouth daily before breakfast.    . LORazepam (ATIVAN) 0.5 MG tablet Take 0.5 mg by mouth 3 (three) times daily as needed for anxiety.      Musculoskeletal: Strength & Muscle Tone: within normal limits Gait & Station: normal Patient leans: N/A  Psychiatric Specialty Exam: Physical Exam  Nursing note and vitals reviewed. Constitutional: She appears well-developed and well-nourished.  HENT:  Head: Normocephalic and atraumatic.  Eyes: Pupils are equal, round, and reactive to light. Conjunctivae are normal.  Neck: Normal range of motion.  Cardiovascular: Regular rhythm and normal heart sounds.  Respiratory: Effort normal.  GI: Soft. She  exhibits no distension.  Musculoskeletal: Normal range of motion.  Neurological: She is alert.  Skin: Skin is warm and dry.  Psychiatric: Her affect is labile. Her speech is tangential. She is agitated. Thought content is paranoid. Cognition and memory are impaired. She expresses impulsivity. She expresses suicidal ideation.    Review of Systems  Constitutional: Negative.   HENT: Negative.   Eyes: Negative.   Respiratory: Negative.   Cardiovascular: Negative.   Gastrointestinal: Negative.   Musculoskeletal: Negative.   Skin: Negative.   Neurological: Negative.   Psychiatric/Behavioral: Positive for depression, hallucinations, memory loss and suicidal ideas. Negative for substance abuse. The patient is nervous/anxious and has insomnia.     Blood pressure 134/80, pulse 74, temperature 98.1 F (36.7 C), temperature source Oral, resp. rate 18, height 5\' 6"  (1.676 m), weight 102.1 kg, SpO2 99 %.Body mass index is 36.32 kg/m.  General Appearance: Disheveled  Eye Contact:  Minimal  Speech:  Garbled  Volume:  Increased  Mood:  Depressed and Dysphoric  Affect:  Labile  Thought Process:  Disorganized  Orientation:  Full (Time, Place, and Person)  Thought Content:  Illogical  Suicidal Thoughts:  Yes.  without intent/plan  Homicidal Thoughts:  No  Memory:  Immediate;   Fair Recent;   Fair Remote;   Fair  Judgement:  Impaired  Insight:  Shallow  Psychomotor Activity:  Decreased  Concentration:  Concentration: Poor  Recall:  Poor  Fund of Knowledge:  Fair  Language:  Fair  Akathisia:  No  Handed:  Right  AIMS (if indicated):     Assets:  Desire for Improvement  ADL's:  Impaired  Cognition:  Impaired,  Mild  Sleep:        Treatment Plan Summary: Daily contact with patient to assess and evaluate symptoms and progress in treatment, Medication management and Plan Admit patient to psychiatric unit.  Continue current psychiatric medicine.  Monitor and treat diabetes and medical  problems.  15-minute checks in place.  Of labs to be evaluated.  Disposition: Recommend psychiatric Inpatient admission when medically cleared. Supportive therapy provided about ongoing stressors.  Mordecai Rasmussen, MD 03/10/2018 7:40 PM

## 2018-03-10 NOTE — Progress Notes (Signed)
Administrator of group home,  Nada LibmanSherry [address], called expressing concern about how the patient sounded on the phone.  She stated that the patient "didn't sound like herself."  She stated that she had spoken to the patient's sister and that she agreed that the patient didn't sound like she was ready to be discharged.  Administrator stated that she had been looking for other placement for the patient today and was willing to come get the patient if she was discharged today.  Notified Dr. Toni Amendlapacs of phone conversation between RN and Production designer, theatre/television/filmadministrator.  Dr. Toni Amendlapacs assessing patient at this time.

## 2018-03-10 NOTE — ED Notes (Signed)
Pt given meal tray.

## 2018-03-11 ENCOUNTER — Encounter: Payer: Self-pay | Admitting: Psychiatry

## 2018-03-11 DIAGNOSIS — Z593 Problems related to living in residential institution: Secondary | ICD-10-CM

## 2018-03-11 DIAGNOSIS — E119 Type 2 diabetes mellitus without complications: Secondary | ICD-10-CM

## 2018-03-11 DIAGNOSIS — E785 Hyperlipidemia, unspecified: Secondary | ICD-10-CM

## 2018-03-11 DIAGNOSIS — E039 Hypothyroidism, unspecified: Secondary | ICD-10-CM | POA: Diagnosis present

## 2018-03-11 LAB — LIPID PANEL
CHOL/HDL RATIO: 3.4 ratio
CHOLESTEROL: 168 mg/dL (ref 0–200)
HDL: 49 mg/dL (ref >40)
LDL Cholesterol: 103 mg/dL — ABNORMAL HIGH (ref 0–99)
Triglycerides: 79 mg/dL (ref <150)
VLDL: 16 mg/dL (ref 0–40)

## 2018-03-11 LAB — HEMOGLOBIN A1C
Hgb A1c MFr Bld: 7.2 % — ABNORMAL HIGH (ref 4.8–5.6)
Mean Plasma Glucose: 159.94 mg/dL

## 2018-03-11 LAB — GLUCOSE, CAPILLARY
GLUCOSE-CAPILLARY: 131 mg/dL — AB (ref 70–99)
GLUCOSE-CAPILLARY: 140 mg/dL — AB (ref 70–99)
GLUCOSE-CAPILLARY: 77 mg/dL (ref 70–99)
GLUCOSE-CAPILLARY: 114 mg/dL — AB (ref 70–99)
Glucose-Capillary: 66 mg/dL — ABNORMAL LOW (ref 70–99)

## 2018-03-11 LAB — TSH: TSH: 6.643 u[IU]/mL — ABNORMAL HIGH (ref 0.350–4.500)

## 2018-03-11 NOTE — Progress Notes (Signed)
Received Stephanie Nicholson this AM after breakfast, she was compliant with her medications and insulin.She feels less anxious and depressed since her admission.  She denied feeling suicidal or homicidal.  She has been OOB in the milieu at intervals throughout the day.

## 2018-03-11 NOTE — [facility] Group Notes (Signed)
[facility] Group Notes:  (Nursing/MHT/Case Management/Adjunct)  Date:  03/11/2018  Time:  1:58 AM  Type of Therapy:  Group Therapy  Participation Level:  Did Not Attend   Summary of Progress/Problems: Not on unit at time of group  Jinger NeighborsKeith D Tondra Reierson 03/11/2018, 1:58 AM

## 2018-03-11 NOTE — [facility] Suicide Risk Assessment (Signed)
[facility]   Nursing information obtained from:  Patient Demographic factors:  NA Current Mental Status:  NA Loss Factors:  NA Historical Factors:  NA Risk Reduction Factors:  NA  Total Time spent with patient: 15 minutes Principal Problem: Schizoaffective disorder, bipolar type (HCC) Diagnosis:   Patient Active Problem List   Diagnosis Date Noted  . Hypothyroidism [E03.9] 03/11/2018  . Hyperlipemia [E78.5] 03/11/2018  . Schizoaffective disorder, bipolar type (HCC) [F25.0] 03/10/2018  . Schizo affective schizophrenia (HCC) [F25.0] 03/08/2018  . Diabetes mellitus without complication (HCC) [E11.9] 03/08/2018  . Agitation [R45.1] 03/08/2018   Subjective Data: Patient brought from group home due to verbal altercation with staff.  Denies any suicidal thoughts currently  Continued Clinical Symptoms:  Alcohol Use Disorder Identification Test Final Score (AUDIT): 0 The "Alcohol Use Disorders Identification Test", Guidelines for Use in Primary Care, Second Edition.  World Science writerHealth Organization [facility](WHO). Score between 0-7:  no or low risk or alcohol related problems. Score between 8-15:  moderate risk of alcohol related problems. Score between 16-19:  high risk of alcohol related problems. Score 20 or above:  warrants further diagnostic evaluation for alcohol dependence and treatment.   CLINICAL FACTORS:   Unstable or Poor Therapeutic Relationship   Musculoskeletal: Strength & Muscle Tone: Is a walker for ambulation Gait & Station: unsteady Patient leans: Front  Psychiatric Specialty Exam: Physical Exam  ROS  Blood pressure 100/60, pulse 88, temperature 97.6 F (36.4 C), temperature source Oral, resp. rate 18, height 5\' 6"  (1.676 m), weight 102.1 kg, SpO2 100 %.Body mass index is 36.32 kg/m.  General Appearance: Disheveled  Eye Contact:  Poor  Speech:  Slow  Volume:  Decreased  Mood:  Dysphoric  Affect:  Labile  Thought Process:  Disorganized   Orientation:  Full (Time, Place, and Person)  Thought Content:  Tangential  Suicidal Thoughts:  No  Homicidal Thoughts:  No  Memory:  NA  Judgement:  Impaired  Insight:  Lacking  Psychomotor Activity:  Decreased  Assets:  Vocational/Educational  ADL's:  Intact  Cognition:  Impaired,  Moderate  Sleep:  Number of Hours: 7.15      COGNITIVE FEATURES THAT CONTRIBUTE TO RISK:  Loss of executive function and Thought constriction (tunnel vision)    SUICIDE RISK:   Mild:  Suicidal ideation of limited frequency, intensity, duration, and specificity.  There are no identifiable plans, no associated intent, mild dysphoria and related symptoms, good self-control (both objective and subjective assessment), few other risk factors, and identifiable protective factors, including available and accessible social support.  PLAN OF CARE: Unable to contract for safety, denies previous suicidal attempts.  Denied denies active suicidal thoughts.  I certify that inpatient services furnished can reasonably be expected to improve the patient's condition.   Aundria RudGopalkumar Jenalee Trevizo, MD 03/11/2018, 5:32 PM

## 2018-03-11 NOTE — Progress Notes (Signed)
Patient ID: Stephanie Nicholson, female   DOB: 11/18/1965, 52 y.o.   MRN: [id number] Patient presents involuntarily from a group home . Had been getting in altercation with staff at the group home, trying to harm herself and others. "I want to be independent. I don't need a guardian". Patient reports that she wants to take care of herself, that she is well enough to care for self. Reports that she is missing her boyfriend, that she has not seen him for 3 months. Patient denies suicidal thoughts upon admission. Denies homicidal thoughts.  She is cooperative and keeps saying that she does not want to have a guardian.  Skin assessment performed by this Clinical research associatewriter, staffed with Guido SanderBukola, RN. No skin issues noted. Patient is admitted and oriented to the unit. Safety precautions initiated. MD to evaluate in AM.
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
## 2018-03-11 NOTE — Tx Team (Signed)
Initial Treatment Plan 03/11/2018 12:21 AM Sheran SpineLaura Mattox [id number]    PATIENT STRESSORS: Loss of self care ability Medication change or noncompliance   PATIENT STRENGTHS: Ability for insight Communication skills General fund of knowledge   PATIENT IDENTIFIED PROBLEMS: Medication non compliance,  Aggression                   DISCHARGE CRITERIA:  Ability to meet basic life and health needs Adequate post-discharge living arrangements Improved stabilization in mood, thinking, and/or behavior Motivation to continue treatment in a less acute level of care  PRELIMINARY DISCHARGE PLAN: Attend aftercare/continuing care group Outpatient therapy Return to previous living arrangement  PATIENT/FAMILY INVOLVEMENT: This treatment plan has been presented to and reviewed with the patient, Sheran SpineLaura Sessa.  The patient has been given the opportunity to ask questions and make suggestions.  Olin PiaByungura, Francile Woolford, RN 03/11/2018, 12:21 AM

## 2018-03-11 NOTE — Progress Notes (Signed)
Hypoglycemic Event  CBG: 66 @1553   Treatment: 15 GM carbohydrate snack  Symptoms: None  Follow-up CBG: Time:1617 CBG Result:77  Possible Reasons for Event: Unknown  Comments/MD notified: Kathy Breachakesh MD    Stephanie Nicholson

## 2018-03-11 NOTE — [facility] Group Notes (Signed)
LCSW Group Therapy Note  03/11/2018 1:15pm  Type of Therapy and Topic:  Group Therapy:  Healthy Self Image and Positive Change  Participation Level:  Did Not Attend   Description of Group:  In this group, patients will compare and contrast their current "I am...." statements to the visions they identify as desirable for their lives.  Patients discuss fears and how they can make positive changes in their cognitions that will positively impact their behaviors.  Facilitator played a motivational 3-minute speech and patients were left with the task of thinking about what "I am...." statements they can start using in their lives immediately.  Therapeutic Goals: 1. Patient will state their current self-perception as expressed in an "I Am" statement 2. Patient will contrast this with their desired vision for their live 3. Patient will identify 3 fears that negatively impact their behavior 4. Patient will discuss cognitive distortions that stem from their fears 5. Patient will verbalize statements that challenge their cognitive distortions  Summary of Patient Progress:  Pt was invited to attend group but chose not to attend. CSW will continue to encourage pt to attend group throughout their admission.    Therapeutic Modalities Cognitive Behavioral Therapy Motivational Interviewing  Latarra Eagleton  CUEBAS-COLON, LCSW 03/11/2018 11:16 AM 

## 2018-03-12 LAB — GLUCOSE, CAPILLARY
GLUCOSE-CAPILLARY: 117 mg/dL — AB (ref 70–99)
GLUCOSE-CAPILLARY: 101 mg/dL — AB (ref 70–99)
Glucose-Capillary: 121 mg/dL — ABNORMAL HIGH (ref 70–99)
Glucose-Capillary: 139 mg/dL — ABNORMAL HIGH (ref 70–99)

## 2018-03-12 MED ORDER — BENZTROPINE MESYLATE 1 MG PO TABS
1.0000 mg | ORAL_TABLET | Freq: Two times a day (BID) | ORAL | Status: DC
  Administered 2018-03-12 – 2018-03-14 (×4): 1 mg via ORAL
  Filled 2018-03-12 (×4): qty 1

## 2018-03-12 MED ORDER — INSULIN GLARGINE 100 UNIT/ML ~~LOC~~ SOLN
14.0000 [IU] | Freq: Every day | SUBCUTANEOUS | Status: DC
  Administered 2018-03-12 – 2018-03-14 (×3): 14 [IU] via SUBCUTANEOUS
  Filled 2018-03-12 (×4): qty 0.14
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
## 2018-03-12 NOTE — Plan of Care (Addendum)
Patient found in day room upon my arrival. Patient is visible but not social this evening. Patient is minimally verbal. Affect is flat. Stated mood is "fine." Patient CBG had been low throughout the day and @HS  is was 116. Patient ate a small snack. Contacted on call MD to discuss 40 unit dose of Lantus. Dr. Kathy Breachakesh instructs writer to hold dose. Patient denies all complaints including SI/HI/AVH. Eating adequately. No other scheduled HS medications. Compliant with staff direction. Q 15 minute checks maintained. Will continue to monitor throughout the shift. Patient slept 8 hours. No apparent distress. Will endorse care to oncoming shift.    Problem: Education: Goal: Emotional status will improve Outcome: Progressing Goal: Mental status will improve Outcome: Progressing   Problem: Coping: Goal: Ability to demonstrate self-control will improve Outcome: Progressing
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
## 2018-03-12 NOTE — [facility] Group Notes (Signed)
[facility] Group Notes:  (Nursing/MHT/Case Management/Adjunct)  Date:  03/12/2018  Time:  12:41 AM  Type of Therapy:  Group Therapy  Participation Level:  Active  Participation Quality:  Appropriate  Affect:  Appropriate  Cognitive:  Alert  Insight:  Good  Engagement in Group:  Engaged  Modes of Intervention:  Support  Summary of Progress/Problems:  Stephanie Nicholson 03/12/2018, 12:41 AM

## 2018-03-12 NOTE — Progress Notes (Signed)
D: Pt denies SI/HI/AVH, verbally able to contract for safety. Pt is pleasant and cooperative overall. Pt. Preoccupied with post discharge living arrangements, which is causing worries. Pt. Reports depression and anxiety, "5/10" for both. Denies need for PRN anxiety medications. Pt. Presents tangential, preoccupied, and a bit anxious during assessments. Pt. A bit animated at times. Overall mood is labile.   A: Q x 15 minute observation checks were completed for safety. Patient was provided with education, but needs lots of reinforcement.  Patient was given/offered medications per orders. Patient  was encourage to attend groups, participate in unit activities and continue with plan of care. Pt. Chart and plans of care reviewed. Pt. Given support and encouragement.   R: Patient is complaint with medication and unit procedures. Pt. Attends snacks and groups appropriately. Pt. Blood sugars monitored per orders for safety.              Precautionary checks every 15 minutes for safety maintained, room free of safety hazards, patient sustains no injury or falls during this shift. Will endorse care to next shift.

## 2018-03-12 NOTE — Progress Notes (Signed)
Received Stephanie RiegerLaura this AM after breakfast, she was compliant with her medications and insulin. She stated feeling better which includes less depressed and anxious. She wants to contact a lawyer and will ask the social workerfor assistance on Monday. She has been OOB in the milieu at intervals without incident.
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
## 2018-03-12 NOTE — [facility] Group Notes (Signed)
LCSW Group Therapy Note 03/12/2018 1:15pm  Type of Therapy and Topic: Group Therapy: Feelings Around Returning Home & Establishing a Supportive Framework and Supporting Oneself When Supports Not Available  Participation Level: Active  Description of Group:  Patients first processed thoughts and feelings about upcoming discharge. These included fears of upcoming changes, lack of change, new living environments, judgements and expectations from others and overall stigma of mental health issues. The group then discussed the definition of a supportive framework, what that looks and feels like, and how do to discern it from an unhealthy non-supportive network. The group identified different types of supports as well as what to do when your family/friends are less than helpful or unavailable  Therapeutic Goals  1. Patient will identify one healthy supportive network that they can use at discharge. 2. Patient will identify one factor of a supportive framework and how to tell it from an unhealthy network. 3. Patient able to identify one coping skill to use when they do not have positive supports from others. 4. Patient will demonstrate ability to communicate their needs through discussion and/or role plays.  Summary of Patient Progress:  Patient reported "I am not shaking today." Pt engaged during group session. As patients processed their anxiety about discharge and described healthy supports patient shared that she is ready to be discharge because she has court on Thursday. Pt listed her sister and friend as her support system. Patients identified at least one self-care tool they were willing to use after discharge; learn and practice deep breathing.   Therapeutic Modalities Cognitive Behavioral Therapy Motivational Interviewing   Johnpatrick Jenny  CUEBAS-COLON, LCSW 03/12/2018 10:08 AM

## 2018-03-13 DIAGNOSIS — F250 Schizoaffective disorder, bipolar type: Principal | ICD-10-CM

## 2018-03-13 LAB — GLUCOSE, CAPILLARY
Glucose-Capillary: 167 mg/dL — ABNORMAL HIGH (ref 70–99)
Glucose-Capillary: 149 mg/dL — ABNORMAL HIGH (ref 70–99)
Glucose-Capillary: 175 mg/dL — ABNORMAL HIGH (ref 70–99)
Glucose-Capillary: 177 mg/dL — ABNORMAL HIGH (ref 70–99)

## 2018-03-13 MED ORDER — AMANTADINE HCL 100 MG PO CAPS
100.0000 mg | ORAL_CAPSULE | Freq: Once | ORAL | Status: AC
  Administered 2018-03-13: 100 mg via ORAL
  Filled 2018-03-13: qty 1
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
## 2018-03-13 NOTE — Plan of Care (Signed)
Information given in concrete form for better understanding . Emotional  and  mental status improved  . Working on Materials engineercoping  skills. No self control  concerns   Voice of no safety. Problem: Education: Goal: Knowledge of [place] [facility] Education information/materials will improve Outcome: Progressing Goal: Emotional status will improve Outcome: Progressing Goal: Mental status will improve Outcome: Progressing Goal: Verbalization of understanding the information provided will improve Outcome: Progressing   Problem: Coping: Goal: Ability to verbalize frustrations and anger appropriately will improve Outcome: Progressing Goal: Ability to demonstrate self-control will improve Outcome: Progressing   Problem: Education: Goal: Ability to incorporate positive changes in behavior to improve self-esteem will improve Outcome: Progressing   Problem: Health Behavior/Discharge Planning: Goal: Ability to remain free from injury will improve Outcome: Progressing   Problem: Self-Concept: Goal: Will verbalize positive feelings about self Outcome: Progressing   Problem: Education: Goal: Knowledge of General Education information will improve Description Including pain rating scale, medication(s)/side effects and non-pharmacologic comfort measures Outcome: Progressing

## 2018-03-13 NOTE — Progress Notes (Signed)
Recreation Therapy Notes   Date:03/13/2018  Time:9:30 am  Location:Craft Room  Behavioral response: Appropriate   Intervention Topic: Communication  Discussion/Intervention: Group content today was focused on communication. The group defined communication and ways to communicate with others. Individuals stated reason why communication is important and some reasons to communicate with others. Patients expressed if they thought they were good at communicating with others and ways they could improve their communication skills. The group identified important parts of communication and some experiences they have had in the past with communication. The group participated in the intervention "What is that?", where they had a chance to test out their communication skills and identify ways to improve their communication techniques.  Clinical Observations/Feedback:  Patient came to group late due to unknown reasons. Individual was social with peers and staff while participating in the intervention.  Teiana Hajduk LRT/CTRS          Whitt Auletta 03/13/2018 2:25 PM

## 2018-03-13 NOTE — Progress Notes (Signed)
D: Patient stated slept good last night .Stated appetite is good and energy level  Is normal. Stated concentration is good . Stated Depression Denies suicidal  homicidal ideations  .  No auditory hallucinations  No pain concerns . Appropriate ADL'S. Interacting with peers and staff.  Information given in concrete form for better understanding . Emotional  and  mental status improved  . Working on Materials engineercoping  skills. No self control  concerns   Voice of no safety. A: Encourage patient participation with unit programming . Instruction  Given on  Medication , verbalize understanding. R: Voice no other concerns. Staff continue to monitor

## 2018-03-13 NOTE — [facility] Group Notes (Signed)
[facility] Group Notes:  (Nursing/MHT/Case Management/Adjunct)  Date:  03/13/2018  Time:  11:16 PM  Type of Therapy:  Group Therapy  Participation Level:  Active  Participation Quality:  Appropriate  Affect:  Appropriate  Cognitive:  Appropriate  Insight:  Appropriate  Engagement in Group:  Engaged  Modes of Intervention:  Discussion  Summary of Progress/Problems:  Stephanie [name] 03/13/2018, 11:16 PM

## 2018-03-13 NOTE — Progress Notes (Signed)
Inpatient Diabetes Program Recommendations  AACE/ADA: New Consensus Statement on Inpatient Glycemic Control (2019)  Target Ranges:  Prepandial:   less than 140 mg/dL      Peak postprandial:   less than 180 mg/dL (1-2 hours)      Critically ill patients:  140 - 180 mg/dL   Results for Stephanie Nicholson, Stephanie Nicholson (MRN [id number]) as of 03/13/2018 08:48  Ref. Range 03/12/2018 07:02 03/12/2018 11:14 03/12/2018 16:18 03/12/2018 20:12 03/13/2018 06:58  Glucose-Capillary Latest Ref Range: 70 - 99 mg/dL 952121 (H) 841139 (H) 324117 (H) 101 (H) 167 (H)   Review of Glycemic Control  Diabetes history: DM2 Outpatient Diabetes medications: Lantus 37 units QHS, Glipizide 2.5 mg QAM Current orders for Inpatient glycemic control: Lantus 14 units QHS, Novolog 0-15 units TID with meals, Glipizide 2.5 mg QAM  Inpatient Diabetes Program Recommendations:  Insulin - Basal: Noted Lantus was changed to 14 units QHS and patient recieved last night. Fasting glucose 167 mg/dl today. HgbA1C: A1C 7.2% on 03/11/18 indicating an average glucose of 160 mg/dl over the past 2-3 months.   NOTE: Noted consult. Chart reviewed. Noted adjustment made to Lantus dose. Fasting glucose 167 mg/dl today. Agree with current orders for glycemic control. Will continue to follow daily and make recommendations if needed.  Thanks, Orlando PennerMarie Donnella Morford, RN, MSN, CDE Diabetes Coordinator Inpatient Diabetes Program [phone] (Team Pager from 8am to 5pm)

## 2018-03-13 NOTE — [facility] Group Notes (Signed)
[facility] Group Notes:  (Nursing/MHT/Case Management/Adjunct)  Date:  03/13/2018  Time:  9:34 AM  Type of Therapy:  Psychoeducational Skills  Participation Level:  Active  Participation Quality:  Monopolizing  Affect:  Flat  Cognitive:  Disorganized and Lacking  Insight:  Limited  Engagement in Group:  Distracting, Off Topic and Poor  Modes of Intervention:  Clarification and Discussion  Summary of Progress/Problems:  Foy GuadalajaraJasmine R Gregroy Dombkowski 03/13/2018, 9:34 AM

## 2018-03-14 LAB — GLUCOSE, CAPILLARY
GLUCOSE-CAPILLARY: 121 mg/dL — AB (ref 70–99)
GLUCOSE-CAPILLARY: 173 mg/dL — AB (ref 70–99)
Glucose-Capillary: 112 mg/dL — ABNORMAL HIGH (ref 70–99)
Glucose-Capillary: 162 mg/dL — ABNORMAL HIGH (ref 70–99)

## 2018-03-14 MED ORDER — AMANTADINE HCL 100 MG PO CAPS
100.0000 mg | ORAL_CAPSULE | Freq: Two times a day (BID) | ORAL | Status: DC
  Administered 2018-03-14 – 2018-03-15 (×4): 100 mg via ORAL
  Filled 2018-03-14 (×4): qty 1

## 2018-03-14 MED ORDER — TUBERCULIN PPD 5 UNIT/0.1ML ID SOLN
5.0000 [IU] | Freq: Once | INTRADERMAL | Status: DC
  Administered 2018-03-14: 5 [IU] via INTRADERMAL
  Filled 2018-03-14: qty 0.1
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
## 2018-03-14 NOTE — [facility] Group Notes (Signed)
03/14/2018 1PM  Type of Therapy/Topic:  Group Therapy:  Feelings about Diagnosis  Participation Level:  Minimal   Description of Group:   This group will allow patients to explore their thoughts and feelings about diagnoses they have received. Patients will be guided to explore their level of understanding and acceptance of these diagnoses. Facilitator will encourage patients to process their thoughts and feelings about the reactions of others to their diagnosis and will guide patients in identifying ways to discuss their diagnosis with significant others in their lives. This group will be process-oriented, with patients participating in exploration of their own experiences, giving and receiving support, and processing challenge from other group members.   Therapeutic Goals: 1. Patient will demonstrate understanding of diagnosis as evidenced by identifying two or more symptoms of the disorder 2. Patient will be able to express two feelings regarding the diagnosis 3. Patient will demonstrate their ability to communicate their needs through discussion and/or role play  Summary of Patient Progress: Actively and appropriately engaged in the group. Patient practiced active listening when interacting with the facilitator and other group members. Patient was unable to stay on topic of group discussion and had to be redirected at times. Patient is still in the process of obtaining treatment goals.        Therapeutic Modalities:   Cognitive Behavioral Therapy Brief Therapy Feelings Identification    Johny ShearsCassandra  Saidah Kempton, LCSW 03/14/2018 2:12 PM
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
## 2018-03-14 NOTE — Progress Notes (Signed)
Recreation Therapy Notes  INPATIENT RECREATION THERAPY ASSESSMENT  Patient Details Name: Stephanie Nicholson MRN: [id number] DOB: 04/05/1966 Today's Date: 03/14/2018       Information Obtained From: Patient  Able to Participate in Assessment/Interview: Yes  Patient Presentation: Responsive  Reason for Admission (Per Patient): Active Symptoms  Patient Stressors:    Coping Skills:   Talk, Music, Other (Comment)(Cry)  Leisure Interests (2+):  Art - Coloring(Car ridding)  Frequency of Recreation/Participation: Monthly  Awareness of Community Resources:  Yes  Community Resources:  Library  Current Use:    If no, Barriers?: Transportation  Expressed Interest in [employer] CorporationCommunity Resource Information: Yes  [place] of [place]  Patient Main Form of Transportation: Other (Comment)(Group home)  Patient Strengths:  Nice, I like people  Patient Identified Areas of Improvement:  My negativity  Patient Goal for Hospitalization:  To get out and go to court  Current SI (including self-harm):  No  Current HI:  No  Current AVH: No  Staff Intervention Plan: Group Attendance, Collaborate with Interdisciplinary Treatment Team  Consent to Intern Participation: N/A  Hesston Hitchens 03/14/2018, 2:08 PM

## 2018-03-14 NOTE — Tx Team (Addendum)
Interdisciplinary Treatment and Diagnostic Plan Update  03/14/2018 Time of Session: 03/13/2018 Catalina LungerLaura C Pond MRN: [id number]  Principal Diagnosis: Schizoaffective disorder, bipolar type [facility](HCC)  Secondary Diagnoses: Principal Problem:   Schizoaffective disorder, bipolar type (HCC) Active Problems:   Diabetes mellitus without complication (HCC)   Agitation   Hypothyroidism   Hyperlipemia   Current Medications:  Current Facility-Administered Medications  Medication Dose Route Frequency Provider Last Rate Last Dose  . acetaminophen (TYLENOL) tablet 650 mg  650 mg Oral Q6H PRN Clapacs, John T, MD      . alum & mag hydroxide-simeth (MAALOX/MYLANTA) 200-200-20 MG/5ML suspension 30 mL  30 mL Oral Q4H PRN Clapacs, John T, MD      . amantadine (SYMMETREL) capsule 100 mg  100 mg Oral BID Pucilowska, Jolanta B, MD   100 mg at 03/14/18 1036  . ARIPiprazole (ABILIFY) tablet 15 mg  15 mg Oral Daily Clapacs, Jackquline DenmarkJohn T, MD   15 mg at 03/14/18 0741  . aspirin chewable tablet 81 mg  81 mg Oral Daily Clapacs, Jackquline DenmarkJohn T, MD   81 mg at 03/14/18 0742  . atorvastatin (LIPITOR) tablet 10 mg  10 mg Oral Daily Clapacs, Jackquline DenmarkJohn T, MD   10 mg at 03/13/18 1752  . cholecalciferol (VITAMIN D) tablet 1,000 Units  1,000 Units Oral Daily Clapacs, Jackquline DenmarkJohn T, MD   1,000 Units at 03/14/18 [phone]  . furosemide (LASIX) tablet 40 mg  40 mg Oral Daily Clapacs, Jackquline DenmarkJohn T, MD   40 mg at 03/14/18 0741  . gabapentin (NEURONTIN) capsule 600 mg  600 mg Oral BID Clapacs, Jackquline DenmarkJohn T, MD   600 mg at 03/14/18 0741  . glipiZIDE (GLUCOTROL) tablet 2.5 mg  2.5 mg Oral QAC breakfast Clapacs, John T, MD   2.5 mg at 03/14/18 0743  . haloperidol (HALDOL) tablet 0.5 mg  0.5 mg Oral TID PRN Clapacs, Jackquline DenmarkJohn T, MD      . hydrOXYzine (ATARAX/VISTARIL) tablet 50 mg  50 mg Oral TID PRN Clapacs, John T, MD      . insulin aspart (novoLOG) injection 0-15 Units  0-15 Units Subcutaneous TID WC Clapacs, Jackquline DenmarkJohn T, MD   3 Units at 03/14/18 1127  . insulin glargine (LANTUS)  injection 14 Units  14 Units Subcutaneous QHS Pucilowska, Jolanta B, MD   14 Units at 03/13/18 2101  . lamoTRIgine (LAMICTAL) tablet 200 mg  200 mg Oral Daily Clapacs, Jackquline DenmarkJohn T, MD   200 mg at 03/14/18 0741  . levothyroxine (SYNTHROID, LEVOTHROID) tablet 25 mcg  25 mcg Oral QAC breakfast Clapacs, Jackquline DenmarkJohn T, MD   25 mcg at 03/14/18 [phone]  . magnesium hydroxide (MILK OF MAGNESIA) suspension 30 mL  30 mL Oral Daily PRN Clapacs, Jackquline DenmarkJohn T, MD      . tuberculin injection 5 Units  5 Units Intradermal Once Pucilowska, Jolanta B, MD   5 Units at 03/14/18 1042   PTA Medications: Medications Prior to Admission  Medication Sig Dispense Refill Last Dose  . ARIPiprazole (ABILIFY) 15 MG tablet Take 15 mg by mouth daily.   03/08/2018 at 0800  . aspirin 81 MG chewable tablet Chew 81 mg by mouth daily.   03/08/2018 at 0800  . atorvastatin (LIPITOR) 10 MG tablet Take 10 mg by mouth daily.   03/07/2018 at 2000  . cholecalciferol (VITAMIN D) 1000 units tablet Take 1,000 Units by mouth daily.   03/08/2018 at 0800  . fenofibrate (TRICOR) 48 MG tablet Take 48 mg by mouth daily.   03/07/2018 at 2000  .  furosemide (LASIX) 40 MG tablet Take 40 mg by mouth daily.   03/08/2018 at 0800  . gabapentin (NEURONTIN) 300 MG capsule Take 600 mg by mouth 2 (two) times daily.   03/08/2018 at 0800  . glipiZIDE (GLUCOTROL) 5 MG tablet Take 2.5 mg by mouth daily before breakfast.   03/08/2018 at 0800  . haloperidol (HALDOL) 1 MG tablet Take 0.5 mg by mouth 3 (three) times daily as needed for agitation.   03/08/2018 at PRN  . insulin glargine (LANTUS) 100 UNIT/ML injection Inject 37 Units into the skin at bedtime.   03/07/2018 at 2000  . lamoTRIgine (LAMICTAL) 200 MG tablet Take 200 mg by mouth daily.   03/08/2018 at 0800  . levothyroxine (SYNTHROID, LEVOTHROID) 25 MCG tablet Take 25 mcg by mouth daily before breakfast.   03/08/2018 at 0700  . LORazepam (ATIVAN) 0.5 MG tablet Take 0.5 mg by mouth 3 (three) times daily as needed for anxiety.   03/08/2018 at  PRN    Patient Stressors: Loss of self care ability Medication change or noncompliance  Patient Strengths: Ability for insight Communication skills General fund of knowledge  Treatment Modalities: Medication Management, Group therapy, Case management,  1 to 1 session with clinician, Psychoeducation, Recreational therapy.   Physician Treatment Plan for Primary Diagnosis: Schizoaffective disorder, bipolar type (HCC) Long Term Goal(s): Improvement in symptoms so as ready for discharge Improvement in symptoms so as ready for discharge   Short Term Goals: Ability to verbalize feelings will improve Ability to verbalize feelings will improve  Medication Management: Evaluate patient's response, side effects, and tolerance of medication regimen.  Therapeutic Interventions: 1 to 1 sessions, Unit Group sessions and Medication administration.  Evaluation of Outcomes: Progressing  Physician Treatment Plan for Secondary Diagnosis: Principal Problem:   Schizoaffective disorder, bipolar type (HCC) Active Problems:   Diabetes mellitus without complication (HCC)   Agitation   Hypothyroidism   Hyperlipemia  Long Term Goal(s): Improvement in symptoms so as ready for discharge Improvement in symptoms so as ready for discharge   Short Term Goals: Ability to verbalize feelings will improve Ability to verbalize feelings will improve     Medication Management: Evaluate patient's response, side effects, and tolerance of medication regimen.  Therapeutic Interventions: 1 to 1 sessions, Unit Group sessions and Medication administration.  Evaluation of Outcomes: Progressing   RN Treatment Plan for Primary Diagnosis: Schizoaffective disorder, bipolar type (HCC) Long Term Goal(s): Knowledge of disease and therapeutic regimen to maintain health will improve  Short Term Goals: Ability to identify and develop effective coping behaviors will improve and Compliance with prescribed medications will  improve  Medication Management: RN will administer medications as ordered by provider, will assess and evaluate patient's response and provide education to patient for prescribed medication. RN will report any adverse and/or side effects to prescribing provider.  Therapeutic Interventions: 1 on 1 counseling sessions, Psychoeducation, Medication administration, Evaluate responses to treatment, Monitor vital signs and CBGs as ordered, Perform/monitor CIWA, COWS, AIMS and Fall Risk screenings as ordered, Perform wound care treatments as ordered.  Evaluation of Outcomes: Progressing   LCSW Treatment Plan for Primary Diagnosis: Schizoaffective disorder, bipolar type (HCC) Long Term Goal(s): Safe transition to appropriate next level of care at discharge, Engage patient in therapeutic group addressing interpersonal concerns.  Short Term Goals: Engage patient in aftercare planning with referrals and resources, Identify triggers associated with mental health/substance abuse issues and Increase skills for wellness and recovery  Therapeutic Interventions: Assess for all discharge needs, 1 to 1 time  with Social worker, Explore available resources and support systems, Assess for adequacy in community support network, Educate family and significant other(s) on suicide prevention, Complete Psychosocial Assessment, Interpersonal group therapy.  Evaluation of Outcomes: Progressing   Progress in Treatment: Attending groups: Yes. Participating in groups: Yes. Taking medication as prescribed: Yes. Toleration medication: Yes. Family/Significant other contact made: No, will contact:  trying to contact guardian, with no return call at this point Patient understands diagnosis: Yes. Discussing patient identified problems/goals with staff: Yes. Medical problems stabilized or resolved: Yes. Denies suicidal/homicidal ideation: Yes. Issues/concerns per patient self-inventory: No. Other:    New problem(s)  identified: Yes, Describe:   Ms. Shipton has a guardianship hearing on 9/19 to determine who will be her guardian  New Short Term/Long Term Goal(s):  Patient Goals:  To remain her own guardian and got with her boyfriend   Discharge Plan or Barriers: TBD-she may not return to her group home, but there is a possibility of a new placement and this will all depend on if she is deemed competent or not at thursdays Guardianship hearing  Reason for Continuation of Hospitalization: Anxiety Depression Medication stabilization  Estimated Length of Stay:3-5 days  Recreational Therapy: Patient Stressors: N/A Patient Goal: Patient will focus on task/topic with 2 prompts from staff within 5 recreation therapy group sessions  Attendees: Patient:Medha Pawling 03/13/2018 11:45 AM  Physician: Braulio Conte Pucilowska 03/13/2018 11:45 AM  Nursing: Hulan Amato, RN 03/13/2018 11:45 AM  RN Care Manager:   Social Worker: Jake Shark, LCSW 03/13/2018 11:45 AM  Recreational Therapist: Garret Reddish, LRT 03/13/2018 11:45 AM  Other: Johny Shears, LCSWA 03/13/2018 11:45 AM  Other:    Other:     Scribe for Treatment Team: Glennon Mac, LCSW 03/14/2018 11:45 AM

## 2018-03-14 NOTE — Progress Notes (Signed)
Recreation Therapy Notes  Date: 03/14/2018  Time: 9:30 am  Location: Craft Room  Behavioral response: Appropriate, redirected  Intervention Topic: Happiness  Discussion/Intervention:  Group content today was focused on Happiness. The group defined happiness and stated reasons they are and are not happy at times. Participants identified reasons they are normally happy and why. Individuals expressed how not being happy affects themselves and others. Patients stated reasons why happiness is important to them. The group described how they feel when they are happy. Individuals participated in the intervention "What is happiness" where they defined what happiness means to them.  Clinical Observations/Feedback:  Patient came to group and was off topic and redirected by group facilitator to focus on the group topic.Individual was social with peers and staff while participating in the intervention.  Doll Frazee LRT/CTRS         Jamylah Marinaccio 03/14/2018 1:01 PM

## 2018-03-14 NOTE — BHH Group Notes (Signed)
[facility] Group Notes:  (Nursing/MHT/Case Management/Adjunct)  Date:  03/14/2018  Time:  10:18 PM  Type of Therapy:  Group Therapy  Participation Level:  Did Not Attend    Jinger NeighborsKeith D Ferdinando Lodge 03/14/2018, 10:18 PM

## 2018-03-14 NOTE — Plan of Care (Addendum)
Patient found in day room upon my arrival. Patient is visible and social this evening. Denies all complaints including SI/HI/AVH. Reports mood is "fine." Affect appropriate. Eating and voiding adequately. Compliant with HS medication and staff direction. CBG 177. Attends group. Q 15 minute checks maintained. Will continue to monitor throughout the shift. Patient slept 5 hours. No apparent distress. Will endorse care to oncoming shift.  Problem: Education: Goal: Emotional status will improve Outcome: Progressing Goal: Mental status will improve Outcome: Progressing   Problem: Coping: Goal: Ability to verbalize frustrations and anger appropriately will improve Outcome: Progressing Goal: Ability to demonstrate self-control will improve Outcome: Progressing

## 2018-03-15 LAB — GLUCOSE, CAPILLARY
GLUCOSE-CAPILLARY: 132 mg/dL — AB (ref 70–99)
GLUCOSE-CAPILLARY: 128 mg/dL — AB (ref 70–99)
Glucose-Capillary: 165 mg/dL — ABNORMAL HIGH (ref 70–99)

## 2018-03-15 MED ORDER — AMANTADINE HCL 100 MG PO CAPS: 100.0000 mg | ORAL_CAPSULE | Freq: Two times a day (BID) | ORAL | 1 refills | Status: AC

## 2018-03-15 MED ORDER — INSULIN GLARGINE 100 UNIT/ML ~~LOC~~ SOLN: 14.0000 [IU] | Freq: Every day | SUBCUTANEOUS | 11 refills | Status: AC

## 2018-03-15 NOTE — Progress Notes (Signed)
D: Patient is aware of  Discharge this shift .Patient denies suicidal /homicidal ideations. Patient received all belongings brought in  A: No Storage medications. Writer reviewed Discharge Summary, Suicide Risk Assessment, and Transitional Record. Patient also received Prescriptions   from  MD. FL2 and prescriptions  given to group home staff . Aware  Of follow up appointment . R: Patient left unit with no questions  Or concerns  With group home staff.

## 2018-03-15 NOTE — Progress Notes (Signed)
Recreation Therapy Notes  INPATIENT RECREATION THERAPY ASSESSMENT  Patient Details Name: Catalina LungerLaura C Mcduffey MRN: [id number] DOB: 03/15/1966 Today's Date: 03/15/2018       Information Obtained From: Patient  Able to Participate in Assessment/Interview: Yes  Patient Presentation: Responsive  Reason for Admission (Per Patient): Active Symptoms  Patient Stressors:    Coping Skills:   Talk, Music, Other (Comment)(Cry)  Leisure Interests (2+):  Art - Coloring(Car ridding)  Frequency of Recreation/Participation: Monthly  Awareness of Community Resources:  Yes  Community Resources:  Library  Current Use:    If no, Barriers?: Transportation  Expressed Interest in [employer] CorporationCommunity Resource Information: Yes  [place] [place] of [place]  Patient Main Form of Transportation: Other (Comment)(Group home)  Patient Strengths:  Nice, I like people  Patient Identified Areas of Improvement:  My negativity  Patient Goal for Hospitalization:  To get out and go to court  Current SI (including self-harm):  No  Current HI:  No  Current AVH: No  Staff Intervention Plan: Group Attendance, Collaborate with Interdisciplinary Treatment Team  Consent to Intern Participation: N/A  Mccrae Speciale 03/15/2018, 3:19 PM

## 2018-03-15 NOTE — BHH Suicide Risk Assessment (Signed)
BHH Discharge Suicide Risk Assessment  [facility] Principal Problem: Schizoaffective disorder, bipolar type [facility](HCC) Discharge Diagnoses:  Patient Active Problem List   Diagnosis Date Noted  . Schizoaffective disorder, bipolar type (HCC) [F25.0] 03/10/2018    Priority: High  . Hypothyroidism [E03.9] 03/11/2018  . Hyperlipemia [E78.5] 03/11/2018  . Schizo affective schizophrenia (HCC) [F25.0] 03/08/2018  . Diabetes mellitus without complication (HCC) [E11.9] 03/08/2018  . Agitation [R45.1] 03/08/2018    Total Time spent with patient: 20 minutes  Musculoskeletal: Strength & Muscle Tone: within normal limits Gait & Station: normal Patient leans: N/A  Psychiatric Specialty Exam: Review of Systems  Neurological: Negative.   Psychiatric/Behavioral: Negative.   All other systems reviewed and are negative.   Blood pressure 98/60, pulse 65, temperature 98.1 F (36.7 C), resp. rate 18, height 5\' 6"  (1.676 m), weight 102.1 kg, SpO2 100 %.Body mass index is 36.32 kg/m.  General Appearance: Casual  Eye Contact::  Good  Speech:  Clear and Coherent409  Volume:  Normal  Mood:  Euthymic  Affect:  Appropriate  Thought Process:  Goal Directed and Descriptions of Associations: Intact  Orientation:  Full (Time, Place, and Person)  Thought Content:  WDL  Suicidal Thoughts:  No  Homicidal Thoughts:  No  Memory:  Immediate;   Fair Recent;   Fair Remote;   Fair  Judgement:  Impaired  Insight:  Shallow  Psychomotor Activity:  Normal  Concentration:  Fair  Recall:  [employer]  Fund of Knowledge:Fair  Language: Fair  Akathisia:  No  Handed:  Right  AIMS (if indicated):     Assets:  Communication Skills Desire for Improvement Financial Resources/Insurance Housing Physical Health Resilience Social Support Transportation  Sleep:  Number of Hours: 5.75  Cognition: WNL  ADL's:  Intact   Mental Status Per Nursing Assessment::   On Admission:  NA  Demographic Factors:  Caucasian  Loss  Factors: Loss of significant relationship and Legal issues  Historical Factors: Impulsivity  Risk Reduction Factors:   Sense of responsibility to family, Living with another person, especially a relative and Positive social support  Continued Clinical Symptoms:  Depression:   Impulsivity  Cognitive Features That Contribute To Risk:  None    Suicide Risk:  Minimal: No identifiable suicidal ideation.  Patients presenting with no risk factors but with morbid ruminations; may be classified as minimal risk based on the severity of the depressive symptoms    Plan Of Care/Follow-up recommendations:  Activity:  as tolerated Diet:  low sodium heart healthy Other:  keep follow up appointments  Kristine LineaJolanta Tymira Horkey, MD 03/15/2018, 11:44 AM

## 2018-03-15 NOTE — [facility] Group Notes (Signed)
LCSW Group Therapy Note  03/15/2018 1:00 pm  Type of Therapy/Topic:  Group Therapy:  Emotion Regulation  Participation Level:  Active   Description of Group:    The purpose of this group is to assist patients in learning to regulate negative emotions and experience positive emotions. Patients will be guided to discuss ways in which they have been vulnerable to their negative emotions. These vulnerabilities will be juxtaposed with experiences of positive emotions or situations, and patients will be challenged to use positive emotions to combat negative ones. Special emphasis will be placed on coping with negative emotions in conflict situations, and patients will process healthy conflict resolution skills.  Therapeutic Goals: 1. Patient will identify two positive emotions or experiences to reflect on in order to balance out negative emotions 2. Patient will label two or more emotions that they find the most difficult to experience 3. Patient will demonstrate positive conflict resolution skills through discussion and/or role plays  Summary of Patient Progress:  Stephanie Nicholson was able to actively participate in today's group discussion on emotion regulation.  Stephanie Nicholson shared that "lonliness" has been one emotion that has been the most difficult to experience.  Stephanie Nicholson shared that she tries to "let it pass through my mind and move on from it" when asked about positive conflict resolution of her difficult emotions.     Therapeutic Modalities:   Cognitive Behavioral Therapy Feelings Identification Dialectical Behavioral Therapy
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
## 2018-03-15 NOTE — Progress Notes (Signed)
Recreation Therapy Notes  Date: 03/15/2018  Time: 9:30 am  Location: Craft Room  Behavioral response: Appropriate    Intervention Topic: Team work  Discussion/Intervention:  Group content on today was focused on teamwork. The group identified what teamwork is. Individuals described who is a part of their team. Patients expressed why they thought teamwork is important. The group stated reasons why they thought it was easier to work with a Comptrollersmaller/larger team. Individuals discussed some positives and negatives of working with a team. Patients gave examples of past experiences they had while working with a team. The group participated in the intervention "Story in a bag", patients were in groups and were able to test their skill in a team setting.  Clinical Observations/Feedback:  Patient came to group and states scattergories is team work . Individual was social with peers and staff while participating in the intervention.  Bristol Soy LRT/CTRS         Berley Gambrell 03/15/2018 1:37 PM

## 2018-03-15 NOTE — Plan of Care (Addendum)
Patient found in common area upon my arrival. Patient is visible and social this evening. Mood is "fine," affect is bright. Denies SI/HI/AVH. Denies pain. CBG 177, 14 units Lantus given. Eating and voiding adequately. Patient has no scheduled HS medication aside from Lantus. Compliant with unit routine and staff direction. Q 15 minute checks maintained. Will continue to monitor throughout the shift. Patient slept 5.75 hours. No apparent distress. CBG 165, 3 units coverage to be given just prior to breakfast. Will endorse care to oncoming shift.  Problem: Education: Goal: Emotional status will improve Outcome: Progressing Goal: Mental status will improve Outcome: Progressing   Problem: Coping: Goal: Ability to verbalize frustrations and anger appropriately will improve Outcome: Progressing Goal: Ability to demonstrate self-control will improve Outcome: Progressing   Problem: Health Behavior/Discharge Planning: Goal: Ability to remain free from injury will improve Outcome: Progressing   Problem: Self-Concept: Goal: Will verbalize positive feelings about self Outcome: Progressing

## 2018-03-15 NOTE — NC FL2 (Signed)
[place] MEDICAID FL2 LEVEL OF CARE SCREENING TOOL     IDENTIFICATION  Patient Name: Stephanie Nicholson Birthdate: 06/21/1966 Sex: female Admission Date (Current Location): 03/10/2018  [place]:   [id number] T  Mariano Colon   Facility and Address:   [facility]  [address]  [place], [place]  [address]      Provider Number:  [id number]  Attending Physician Name and Address:  Shari ProwsPucilowska, Jolanta B, MD  Relative Name and Phone Number:   Trenton GammonJo Rogers (sister)  [phone]    Current Level of Care:  Hospital/Inpatient Recommended Level of Care:  Level II/[facility] Prior Approval Number:    Date Approved/Denied:   PASRR Number:    Discharge Plan:  Level II/[place]    Current Diagnoses: Patient Active Problem List   Diagnosis Date Noted  . Hypothyroidism 03/11/2018  . Hyperlipemia 03/11/2018  . Schizoaffective disorder, bipolar type (HCC) 03/10/2018  . Schizo affective schizophrenia (HCC) 03/08/2018  . Diabetes mellitus without complication (HCC) 03/08/2018  . Agitation 03/08/2018    Orientation RESPIRATION BLADDER Height & Weight      Self, Time,Situation, Place   Normal  Continent Weight: 225 lb (102.1 kg) Height:  5\' 6"  (167.6 cm)  BEHAVIORAL SYMPTOMS/MOOD NEUROLOGICAL BOWEL NUTRITION STATUS     Hx of TBI  Continent  Carb Control  AMBULATORY STATUS COMMUNICATION OF NEEDS Skin    Independent  Verbally  Normal                       Personal Care Assistance Level of Assistance   Bathing, Feeding, Dressing  All Independent         Functional Limitations Info   Sight, Hearing, Speech  All Adequate        SPECIAL CARE FACTORS FREQUENCY                       Contractures  Not present    Additional Factors Info                  Current Medications (03/15/2018):  This is the current hospital active medication list Current Facility-Administered Medications  Medication Dose Route Frequency Provider  Last Rate Last Dose  . acetaminophen (TYLENOL) tablet 650 mg  650 mg Oral Q6H PRN Clapacs, John T, MD      . alum & mag hydroxide-simeth (MAALOX/MYLANTA) 200-200-20 MG/5ML suspension 30 mL  30 mL Oral Q4H PRN Clapacs, John T, MD      . amantadine (SYMMETREL) capsule 100 mg  100 mg Oral BID Pucilowska, Jolanta B, MD   100 mg at 03/15/18 0742  . ARIPiprazole (ABILIFY) tablet 15 mg  15 mg Oral Daily Clapacs, Jackquline DenmarkJohn T, MD   15 mg at 03/15/18 0742  . aspirin chewable tablet 81 mg  81 mg Oral Daily Clapacs, Jackquline DenmarkJohn T, MD   81 mg at 03/15/18 0742  . atorvastatin (LIPITOR) tablet 10 mg  10 mg Oral Daily Clapacs, Jackquline DenmarkJohn T, MD   10 mg at 03/14/18 1628  . cholecalciferol (VITAMIN D) tablet 1,000 Units  1,000 Units Oral Daily Clapacs, Jackquline DenmarkJohn T, MD   1,000 Units at 03/15/18 [phone]  . furosemide (LASIX) tablet 40 mg  40 mg Oral Daily Clapacs, Jackquline DenmarkJohn T, MD   40 mg at 03/15/18 0742  . gabapentin (NEURONTIN) capsule 600 mg  600 mg Oral BID Clapacs, Jackquline DenmarkJohn T, MD   600 mg at 03/15/18 0742  . glipiZIDE (GLUCOTROL) tablet  2.5 mg  2.5 mg Oral QAC breakfast Clapacs, John T, MD   2.5 mg at 03/15/18 1610  . haloperidol (HALDOL) tablet 0.5 mg  0.5 mg Oral TID PRN Clapacs, Jackquline Denmark, MD      . hydrOXYzine (ATARAX/VISTARIL) tablet 50 mg  50 mg Oral TID PRN Clapacs, John T, MD      . insulin aspart (novoLOG) injection 0-15 Units  0-15 Units Subcutaneous TID WC Clapacs, Jackquline Denmark, MD   2 Units at 03/15/18 1131  . insulin glargine (LANTUS) injection 14 Units  14 Units Subcutaneous QHS Pucilowska, Jolanta B, MD   14 Units at 03/14/18 2035  . lamoTRIgine (LAMICTAL) tablet 200 mg  200 mg Oral Daily Clapacs, Jackquline Denmark, MD   200 mg at 03/15/18 0741  . levothyroxine (SYNTHROID, LEVOTHROID) tablet 25 mcg  25 mcg Oral QAC breakfast Clapacs, Jackquline Denmark, MD   25 mcg at 03/15/18 0743  . magnesium hydroxide (MILK OF MAGNESIA) suspension 30 mL  30 mL Oral Daily PRN Clapacs, Jackquline Denmark, MD      . tuberculin injection 5 Units  5 Units Intradermal Once Pucilowska, Jolanta B,  MD   5 Units at 03/14/18 1042     Discharge Medications: Please see discharge summary for a list of discharge medications.  Relevant Imaging Results:  Relevant Lab Results:   Additional Information  SS#[id number]  Alease Frame, LCSW

## 2018-03-15 NOTE — Progress Notes (Signed)
Patient ID: Stephanie Nicholson, female   DOB: 10/29/1965, 52 y.o.   MRN: [id number] CSW contacted Stephanie Nicholson with Stephanie Nicholson to inquire about when they would be able to come get pt for d/c.  Stephanie Nicholson informed CSW that she planned to come pick pt up this afternoon (initially around 2:30 PM, but changed to 5:30PM) and that she would get pt to court tomorrow for her guardianship hearing.  CSW was informed that Stephanie Nicholson would be assisting with finding pt a more appropriate placement.  CSW informed Stephanie Nicholson and her RN about her discharge this afternoon.  CSW also contacted Stephanie sister, Trenton GammonJo Rogers and her guardian ad litem, Delma PostBrad Bcuhannon, to inform them of Stephanie [facility].

## 2018-03-15 NOTE — Progress Notes (Signed)
D: Patient stated slept good last night .Stated appetite is good and energy level  Is normal. Stated concentration is good . Stated on Depression  Denies suicidal  homicidal ideations  .  No auditory hallucinations  No pain concerns . Appropriate ADL'S. Interacting with peers and staff.  Affect irritable  And arrgumenative   A: Encourage patient participation with unit programming . Instruction  Given on  Medication , verbalize understanding. R: Voice no other concerns. Staff continue to monitor

## 2018-03-15 NOTE — Progress Notes (Signed)
  [facility] :  Will you be returning to the same living situation after discharge:  Yes,  Pt will be returning to her grouphome. At discharge, do you have transportation home?: Yes,  [facility] staff will be picking her up at discharge. Do you have the ability to pay for your medications: Yes,  Pt has insurance and financial means to pay for her medications.  Release of information consent forms completed and in the chart;  Patient's signature needed at discharge.  Patient to Follow up at: Follow-up Information    [facility]27/2018.   Why:  You have a new patient appointment scheduled with Theresa MulliganKathleen Sisk on above date at 2:45PM.   Contact information: [address] [place] [address] [phone]           Next level of care provider has [facility] to [facility] Link:no  Safety Planning and Suicide Prevention discussed: Yes,  Safety issues have been discussed with pt and grouphome staff.  Have you used any form of tobacco in the last 30 days? (Cigarettes, Smokeless Tobacco, Cigars, and/or Pipes): No  Has patient been referred to the Quitline?: N/A patient is not a smoker  Patient has been referred for addiction treatment: N/A  Alease FrameSonya S Maritssa Haughton, LCSW 03/15/2018, 3:11 PM

## 2018-05-03 ENCOUNTER — Other Ambulatory Visit: Payer: Self-pay | Admitting: Family Medicine

## 2018-05-03 DIAGNOSIS — Z1231 Encounter for screening mammogram for malignant neoplasm of breast: Secondary | ICD-10-CM

## 2018-05-23 ENCOUNTER — Ambulatory Visit (INDEPENDENT_AMBULATORY_CARE_PROVIDER_SITE_OTHER): Payer: Medicare Other | Admitting: Psychology

## 2018-05-23 DIAGNOSIS — F3189 Other bipolar disorder: Secondary | ICD-10-CM | POA: Diagnosis not present

## 2018-06-06 ENCOUNTER — Ambulatory Visit (INDEPENDENT_AMBULATORY_CARE_PROVIDER_SITE_OTHER): Payer: Medicare Other | Admitting: Psychology

## 2018-06-06 DIAGNOSIS — F3189 Other bipolar disorder: Secondary | ICD-10-CM

## 2018-06-07 ENCOUNTER — Ambulatory Visit
Admission: RE | Admit: 2018-06-07 | Discharge: 2018-06-07 | Disposition: A | Discharge status: Home / Self Care | Payer: Medicare Other | Source: Ambulatory Visit | Attending: Family Medicine | Admitting: Family Medicine

## 2018-06-07 DIAGNOSIS — Z1231 Encounter for screening mammogram for malignant neoplasm of breast: Secondary | ICD-10-CM | POA: Insufficient documentation

## 2018-12-23 ENCOUNTER — Other Ambulatory Visit: Payer: Self-pay | Admitting: *Deleted

## 2018-12-23 DIAGNOSIS — Z20822 Contact with and (suspected) exposure to covid-19: Secondary | ICD-10-CM

## 2018-12-30 LAB — NOVEL CORONAVIRUS, NAA: SARS-CoV-2, NAA: NOT DETECTED

## 2019-01-01 ENCOUNTER — Telehealth: Payer: Self-pay | Admitting: Family Medicine

## 2019-01-01 NOTE — Telephone Encounter (Signed)
Negative covid results were given. Patient expressed understanding °

## 2019-03-29 ENCOUNTER — Other Ambulatory Visit: Admission: RE | Admit: 2019-03-29 | Payer: Medicare Other | Source: Ambulatory Visit

## 2019-04-02 ENCOUNTER — Encounter: Admission: RE | Payer: Self-pay | Source: Home / Self Care

## 2019-04-02 ENCOUNTER — Ambulatory Visit: Admission: RE | Admit: 2019-04-02 | Payer: Medicare Other | Source: Home / Self Care | Admitting: Internal Medicine

## 2019-04-02 SURGERY — COLONOSCOPY WITH PROPOFOL
Anesthesia: General

## 2019-05-02 ENCOUNTER — Encounter: Payer: Self-pay | Admitting: Family Medicine

## 2019-05-03 ENCOUNTER — Telehealth: Payer: Self-pay

## 2019-05-03 ENCOUNTER — Other Ambulatory Visit: Payer: Self-pay

## 2019-05-03 DIAGNOSIS — Z1211 Encounter for screening for malignant neoplasm of colon: Secondary | ICD-10-CM

## 2019-05-03 NOTE — Telephone Encounter (Signed)
Gastroenterology Pre-Procedure Review  Request Date: Wed 05/16/19 Requesting Physician: Dr. Bonna Gains  PATIENT REVIEW QUESTIONS: The patients caretaker Rise Paganini) responded to the following health history questions with permission from patient as indicated:    1. Are you having any GI issues? no 2. Do you have a personal history of Polyps? yes (unsure of the year) 3. Do you have a family history of Colon Cancer or Polyps? no 4. Diabetes Mellitus? yes (takes insulin) 5. Joint replacements in the past 12 months?no 6. Major health problems in the past 3 months?no 7. Any artificial heart valves, MVP, or defibrillator?no    MEDICATIONS & ALLERGIES:    Patient reports the following regarding taking any anticoagulation/antiplatelet therapy:   Plavix, Coumadin, Eliquis, Xarelto, Lovenox, Pradaxa, Brilinta, or Effient? no Aspirin? yes (81 mg)  Patient confirms/reports the following medications:  Current Outpatient Medications  Medication Sig Dispense Refill  . amantadine (SYMMETREL) 100 MG capsule Take 1 capsule (100 mg total) by mouth 2 (two) times daily. 60 capsule 1  . ARIPiprazole (ABILIFY) 15 MG tablet Take 15 mg by mouth daily.    Marland Kitchen aspirin 81 MG chewable tablet Chew 81 mg by mouth daily.    Marland Kitchen atorvastatin (LIPITOR) 10 MG tablet Take 10 mg by mouth daily.    . cholecalciferol (VITAMIN D) 1000 units tablet Take 1,000 Units by mouth daily.    . fenofibrate (TRICOR) 48 MG tablet Take 48 mg by mouth daily.    . furosemide (LASIX) 40 MG tablet Take 40 mg by mouth daily.    Marland Kitchen gabapentin (NEURONTIN) 300 MG capsule Take 600 mg by mouth 2 (two) times daily.    Marland Kitchen glipiZIDE (GLUCOTROL) 5 MG tablet Take 2.5 mg by mouth daily before breakfast.    . haloperidol (HALDOL) 1 MG tablet Take 0.5 mg by mouth 3 (three) times daily as needed for agitation.    . insulin glargine (LANTUS) 100 UNIT/ML injection Inject 0.14 mLs (14 Units total) into the skin at bedtime. 10 mL 11  . lamoTRIgine (LAMICTAL) 200 MG  tablet Take 200 mg by mouth daily.    Marland Kitchen levothyroxine (SYNTHROID, LEVOTHROID) 25 MCG tablet Take 25 mcg by mouth daily before breakfast.    . LORazepam (ATIVAN) 0.5 MG tablet Take 0.5 mg by mouth 3 (three) times daily as needed for anxiety.     No current facility-administered medications for this visit.     Patient confirms/reports the following allergies:  No Known Allergies  No orders of the defined types were placed in this encounter.   AUTHORIZATION INFORMATION Primary Insurance: 1D#: Group #:  Secondary Insurance: 1D#: Group #:  SCHEDULE INFORMATION: Date: Wed 05/16/19 Time: Location:[facility]

## 2019-05-10 ENCOUNTER — Telehealth: Payer: Self-pay

## 2019-05-10 NOTE — Telephone Encounter (Signed)
Called patient to confirm patient procedure appointment and to remind patient that they have to go for a COVID test on 05/11/2019.  Patient verbalized understanding   

## 2019-05-11 ENCOUNTER — Other Ambulatory Visit: Payer: Medicare Other | Attending: Gastroenterology

## 2019-05-11 ENCOUNTER — Other Ambulatory Visit: Payer: Self-pay

## 2019-05-11 DIAGNOSIS — Z20822 Contact with and (suspected) exposure to covid-19: Secondary | ICD-10-CM

## 2019-05-14 ENCOUNTER — Other Ambulatory Visit: Payer: Self-pay

## 2019-05-14 ENCOUNTER — Other Ambulatory Visit
Admission: RE | Admit: 2019-05-14 | Discharge: 2019-05-14 | Disposition: A | Discharge status: Home / Self Care | Payer: Medicare Other | Source: Ambulatory Visit | Attending: Gastroenterology | Admitting: Gastroenterology

## 2019-05-14 ENCOUNTER — Telehealth: Payer: Self-pay

## 2019-05-14 ENCOUNTER — Other Ambulatory Visit: Payer: Medicare Other

## 2019-05-14 DIAGNOSIS — Z20828 Contact with and (suspected) exposure to other viral communicable diseases: Secondary | ICD-10-CM | POA: Diagnosis not present

## 2019-05-14 DIAGNOSIS — Z01812 Encounter for preprocedural laboratory examination: Secondary | ICD-10-CM | POA: Insufficient documentation

## 2019-05-14 LAB — NOVEL CORONAVIRUS, NAA: SARS-CoV-2, NAA: NOT DETECTED

## 2019-05-14 MED ORDER — NA SULFATE-K SULFATE-MG SULF 17.5-3.13-1.6 GM/177ML PO SOLN: 354.0000 mL | Freq: Once | ORAL | 0 refills | Status: AC

## 2019-05-14 NOTE — Telephone Encounter (Signed)
Patient went to community [facility]. [facility] needs [facility] test so we know it will be back by her procedure. Tried to call patient number kept ringing but no one would answer

## 2019-05-14 NOTE — Telephone Encounter (Signed)
Talk to patient legal guardian and she states she informed the driver to take her to the [facility]. She states she will get them to retake her today to the [facility]

## 2019-05-14 NOTE — Progress Notes (Signed)
Caregiver states that patient does not have the prep and needs this sent to the pharmacy. Sent medication  to the pharmacy

## 2019-05-14 NOTE — Pre-Procedure Instructions (Signed)
Patient arrived at preadmit testing at 4pm stating that she was contacted this morning from "someone" that told her that the community testing results from 05/11/19 was not acceptable for her procedure.  Not sure who called her but interviewing the patient, she states she was out to eat with the residents of her group home, her family and friends.  Knowing this, we reswabbed her but under normal circumstances, the test from Friday should have been acceptable as the results were negative.

## 2019-05-15 LAB — SARS CORONAVIRUS 2 (TAT 6-24 HRS): SARS Coronavirus 2: NEGATIVE

## 2019-05-16 ENCOUNTER — Ambulatory Visit: Payer: Medicare Other | Admitting: Certified Registered"

## 2019-05-16 ENCOUNTER — Encounter
Admission: RE | Disposition: A | Discharge status: Home / Self Care | Payer: Self-pay | Source: Home / Self Care | Attending: Gastroenterology

## 2019-05-16 ENCOUNTER — Encounter: Payer: Self-pay | Admitting: *Deleted

## 2019-05-16 ENCOUNTER — Ambulatory Visit
Admission: RE | Admit: 2019-05-16 | Discharge: 2019-05-16 | Disposition: A | Discharge status: Home / Self Care | Payer: Medicare Other | Attending: Gastroenterology | Admitting: Gastroenterology

## 2019-05-16 ENCOUNTER — Other Ambulatory Visit: Payer: Self-pay

## 2019-05-16 DIAGNOSIS — Z7982 Long term (current) use of aspirin: Secondary | ICD-10-CM | POA: Insufficient documentation

## 2019-05-16 DIAGNOSIS — Z79899 Other long term (current) drug therapy: Secondary | ICD-10-CM | POA: Insufficient documentation

## 2019-05-16 DIAGNOSIS — E119 Type 2 diabetes mellitus without complications: Secondary | ICD-10-CM | POA: Diagnosis not present

## 2019-05-16 DIAGNOSIS — E785 Hyperlipidemia, unspecified: Secondary | ICD-10-CM | POA: Insufficient documentation

## 2019-05-16 DIAGNOSIS — E669 Obesity, unspecified: Secondary | ICD-10-CM | POA: Diagnosis not present

## 2019-05-16 DIAGNOSIS — F319 Bipolar disorder, unspecified: Secondary | ICD-10-CM | POA: Diagnosis not present

## 2019-05-16 DIAGNOSIS — Z6834 Body mass index (BMI) 34.0-34.9, adult: Secondary | ICD-10-CM | POA: Diagnosis not present

## 2019-05-16 DIAGNOSIS — E039 Hypothyroidism, unspecified: Secondary | ICD-10-CM | POA: Insufficient documentation

## 2019-05-16 DIAGNOSIS — Z79890 Hormone replacement therapy (postmenopausal): Secondary | ICD-10-CM | POA: Insufficient documentation

## 2019-05-16 DIAGNOSIS — Z794 Long term (current) use of insulin: Secondary | ICD-10-CM | POA: Diagnosis not present

## 2019-05-16 DIAGNOSIS — F209 Schizophrenia, unspecified: Secondary | ICD-10-CM | POA: Diagnosis not present

## 2019-05-16 DIAGNOSIS — Z1211 Encounter for screening for malignant neoplasm of colon: Secondary | ICD-10-CM

## 2019-05-16 HISTORY — PX: COLONOSCOPY WITH PROPOFOL: SHX5780

## 2019-05-16 LAB — GLUCOSE, CAPILLARY: Glucose-Capillary: 93 mg/dL (ref 70–99)

## 2019-05-16 SURGERY — COLONOSCOPY WITH PROPOFOL
Anesthesia: General

## 2019-05-16 MED ORDER — PROPOFOL 10 MG/ML IV BOLUS
INTRAVENOUS | Status: AC
  Filled 2019-05-16: qty 20

## 2019-05-16 MED ORDER — PROPOFOL 10 MG/ML IV BOLUS
INTRAVENOUS | Status: DC | PRN
  Administered 2019-05-16: 10 mg via INTRAVENOUS
  Administered 2019-05-16: 40 mg via INTRAVENOUS

## 2019-05-16 MED ORDER — PROPOFOL 500 MG/50ML IV EMUL
INTRAVENOUS | Status: DC | PRN
  Administered 2019-05-16: 100 ug/kg/min via INTRAVENOUS

## 2019-05-16 MED ORDER — LIDOCAINE HCL (CARDIAC) PF 100 MG/5ML IV SOSY
PREFILLED_SYRINGE | INTRAVENOUS | Status: DC | PRN
  Administered 2019-05-16: 40 mg via INTRAVENOUS

## 2019-05-16 MED ORDER — SODIUM CHLORIDE 0.9 % IV SOLN
INTRAVENOUS | Status: DC
  Administered 2019-05-16: 11:00:00 via INTRAVENOUS

## 2019-05-16 MED ORDER — LIDOCAINE HCL (PF) 2 % IJ SOLN
INTRAMUSCULAR | Status: AC
  Filled 2019-05-16: qty 10

## 2019-05-16 NOTE — Op Note (Signed)
[facility] Gastroenterology Patient Name: Stephanie Nicholson Procedure Date: 05/16/2019 11:19 AM MRN: [id number] Account #: [id number] Date of Birth: 1965/09/09 Admit Type: Outpatient Age: 53 Room: [facility] ENDO ROOM 4 Gender: Female Note Status: Finalized Procedure:             Colonoscopy Indications:           Screening for colorectal malignant neoplasm Providers:             Davette Nugent B. Maximino Greenland MD, MD Referring MD:          Milus Mallick. Lodema Hong MD, MD (Referring MD) Medicines:             Monitored Anesthesia Care Complications:         No immediate complications. Procedure:             Pre-Anesthesia Assessment:                        - Prior to the procedure, a History and Physical was                         performed, and patient medications, allergies and                         sensitivities were reviewed. The patient's tolerance                         of previous anesthesia was reviewed.                        - The risks and benefits of the procedure and the                         sedation options and risks were discussed with the                         patient. All questions were answered and informed                         consent was obtained.                        - Patient identification and proposed procedure were                         verified prior to the procedure by the physician, the                         nurse, the anesthetist and the technician. The                         procedure was verified in the pre-procedure area in                         the procedure room in the endoscopy suite.                        - ASA Grade Assessment: II - A patient with mild  systemic disease.                        - After reviewing the risks and benefits, the patient                         was deemed in satisfactory condition to undergo the                         procedure.                        After obtaining informed  consent, the colonoscope was                         passed under direct vision. Throughout the procedure,                         the patient's blood pressure, pulse, and oxygen                         saturations were monitored continuously. The                         Colonoscope was introduced through the anus and                         advanced to the the cecum, identified by appendiceal                         orifice and ileocecal valve. The colonoscopy was                         performed with ease. The patient tolerated the                         procedure well. The quality of the bowel preparation                         was good. Findings:      The perianal and digital rectal examinations were normal.      The rectum, sigmoid colon, descending colon, transverse colon, ascending       colon and cecum appeared normal.      The retroflexed view of the distal rectum and anal verge was normal and       showed no anal or rectal abnormalities. Impression:            - The rectum, sigmoid colon, descending colon,                         transverse colon, ascending colon and cecum are normal.                        - The distal rectum and anal verge are normal on                         retroflexion view.                        - No  specimens collected. Recommendation:        - Discharge patient to home.                        - Resume previous diet.                        - Continue present medications.                        - Repeat colonoscopy in 10 years for screening                         purposes.                        - Return to primary care physician as previously                         scheduled.                        - The findings and recommendations were discussed with                         the patient.                        - The findings and recommendations were discussed with                         the patient's family. Procedure Code(s):     ---  Professional ---                        E3329, Colorectal cancer screening; colonoscopy on                         individual not meeting criteria for high risk Diagnosis Code(s):     --- Professional ---                        Z12.11, Encounter for screening for malignant neoplasm                         of colon CPT copyright 2019 American Medical Association. All rights reserved. The codes documented in this report are preliminary and upon coder review may  be revised to meet current compliance requirements.  [name], MD Margretta Sidle B. Bonna Gains MD, MD 05/16/2019 12:03:27 PM This report has been signed electronically. Number of Addenda: 0 Note Initiated On: 05/16/2019 11:19 AM Scope Withdrawal Time: 0 hours 13 minutes 15 seconds  Total Procedure Duration: 0 hours 24 minutes 23 seconds       [facility]

## 2019-05-16 NOTE — Anesthesia Post-op Follow-up Note (Signed)
Anesthesia QCDR form completed.        

## 2019-05-16 NOTE — Anesthesia Postprocedure Evaluation (Signed)
Anesthesia Post Note  Patient: Stephanie Nicholson  Procedure(s) Performed: COLONOSCOPY WITH PROPOFOL (N/A )  Patient location during evaluation: PACU Anesthesia Type: General Level of consciousness: awake and alert Pain management: pain level controlled Vital Signs Assessment: post-procedure vital signs reviewed and stable Respiratory status: spontaneous breathing, nonlabored ventilation and respiratory function stable Cardiovascular status: blood pressure returned to baseline and stable Postop Assessment: no apparent nausea or vomiting Anesthetic complications: no     Last Vitals:  Vitals:   05/16/19 1023 05/16/19 1204  BP: 135/63 (!) 105/52  Pulse: 65 60  Resp: 20   Temp: (!) 36.3 C (!) 36 C  SpO2: 100% 95%    Last Pain:  Vitals:   05/16/19 1234  TempSrc:   PainSc: 0-No pain                 Durenda Hurt

## 2019-05-16 NOTE — H&P (Signed)
[name], MD [address], [place], [place], [place], [address] [id number], [place], [place], [place], [address] Phone: [phone]  Fax: [phone]  Primary Care Physician:  Remi Haggard, FNP   Pre-Procedure History & Physical: HPI:  Stephanie Nicholson is a 53 y.o. female is here for a colonoscopy.   Past Medical History:  Diagnosis Date  . Diabetes mellitus without complication ([place])   . Hyperlipemia   . Hypothyroidism   . Renal disorder   . Schizo affective schizophrenia [facility])     Past Surgical History:  Procedure Laterality Date  . COLONOSCOPY    . SURGERY OF LIP    . TUBAL LIGATION      Prior to Admission medications   Medication Sig Start Date End Date Taking? Authorizing Provider  amantadine (SYMMETREL) 100 MG capsule Take 1 capsule (100 mg total) by mouth 2 (two) times daily. 03/15/18  Yes Pucilowska, Jolanta B, MD  ARIPiprazole (ABILIFY) 15 MG tablet Take 15 mg by mouth daily.   Yes [provider]  aspirin 81 MG chewable tablet Chew 81 mg by mouth daily.   Yes [provider]  atorvastatin (LIPITOR) 10 MG tablet Take 10 mg by mouth daily.   Yes [provider]  cholecalciferol (VITAMIN D) 1000 units tablet Take 1,000 Units by mouth daily.   Yes [provider]  fenofibrate (TRICOR) 48 MG tablet Take 48 mg by mouth daily.   Yes [provider]  furosemide (LASIX) 40 MG tablet Take 40 mg by mouth daily.   Yes [provider]  gabapentin (NEURONTIN) 300 MG capsule Take 600 mg by mouth 2 (two) times daily.   Yes [provider]  glipiZIDE (GLUCOTROL) 5 MG tablet Take 2.5 mg by mouth daily before breakfast.   Yes [provider]  haloperidol (HALDOL) 1 MG tablet Take 0.5 mg by mouth 3 (three) times daily as needed for agitation.   Yes [provider]  insulin glargine (LANTUS) 100 UNIT/ML injection Inject 0.14 mLs (14 Units total) into the skin at bedtime. 03/15/18  Yes  Pucilowska, Jolanta B, MD  lamoTRIgine (LAMICTAL) 200 MG tablet Take 200 mg by mouth daily.   Yes [provider]  levothyroxine (SYNTHROID, LEVOTHROID) 25 MCG tablet Take 25 mcg by mouth daily before breakfast.   Yes [provider]  LORazepam (ATIVAN) 0.5 MG tablet Take 0.5 mg by mouth 3 (three) times daily as needed for anxiety.   Yes [provider]    Allergies as of 05/03/2019  . (No Known Allergies)    History reviewed. No pertinent family history.  Social History   Socioeconomic History  . Marital status: Single    Spouse name: Not on file  . Number of children: Not on file  . Years of education: Not on file  . Highest education level: Not on file  Occupational History  . Not on file  Social Needs  . Financial resource strain: Not on file  . Food insecurity    Worry: Not on file    Inability: Not on file  . Transportation needs    Medical: Not on file    Non-medical: Not on file  Tobacco Use  . Smoking status: Never Smoker  . Smokeless tobacco: Never Used  Substance and Sexual Activity  . Alcohol use: Not Currently  . Drug use: Not Currently  . Sexual activity: Not on file  Lifestyle  . Physical activity    Days per week: Not on  file    Minutes per session: Not on file  . Stress: Not on file  Relationships  . Social Musician on phone: Not on file    Gets together: Not on file    Attends religious service: Not on file    Active member of club or organization: Not on file    Attends meetings of clubs or organizations: Not on file    Relationship status: Not on file  . Intimate partner violence    Fear of current or ex partner: Not on file    Emotionally abused: Not on file    Physically abused: Not on file    Forced sexual activity: Not on file  Other Topics Concern  . Not on file  Social History Narrative  . Not on file    Review of Systems: See HPI, otherwise negative ROS  Physical Exam: BP 135/63   Pulse  65   Temp (!) 97.4 F (36.3 C) (Temporal)   Resp 20   Ht 5\' 6"  (1.676 m)   Wt 97.5 kg   SpO2 (!) 10%   BMI 34.70 kg/m  General:   Alert,  pleasant and cooperative in NAD Head:  Normocephalic and atraumatic. Neck:  Supple; no masses or thyromegaly. Lungs:  Clear throughout to auscultation, normal respiratory effort.    Heart:  +S1, +S2, Regular rate and rhythm, No edema. Abdomen:  Soft, nontender and nondistended. Normal bowel sounds, without guarding, and without rebound.   Neurologic:  Alert and  oriented x4;  grossly normal neurologically.  Impression/Plan: Stephanie Nicholson is here for a colonoscopy to be performed for average risk screening.  Risks, benefits, limitations, and alternatives regarding  colonoscopy have been reviewed with the patient.  Questions have been answered.  All parties agreeable.   Catalina Lunger, MD  05/16/2019, 10:28 AM

## 2019-05-16 NOTE — Anesthesia Preprocedure Evaluation (Signed)
Anesthesia Evaluation  Patient identified by MRN, date of birth, ID band Patient awake    Reviewed: Allergy & Precautions, H&P , NPO status , Patient's Chart, lab work & pertinent test results  Airway Mallampati: II  TM Distance: >3 FB Neck ROM: full    Dental  (+) Chipped   Pulmonary neg pulmonary ROS,           Cardiovascular negative cardio ROS       Neuro/Psych PSYCHIATRIC DISORDERS Bipolar Disorder Schizophrenia negative neurological ROS     GI/Hepatic negative GI ROS, Neg liver ROS,   Endo/Other  diabetesHypothyroidism   Renal/GU negative Renal ROS  negative genitourinary   Musculoskeletal   Abdominal   Peds  Hematology negative hematology ROS (+)   Anesthesia Other Findings Obese  Past Medical History: No date: Diabetes mellitus without complication ([facility]) No date: Hyperlipemia No date: Hypothyroidism No date: Renal disorder No date: Schizo affective schizophrenia ([place])  Past Surgical History: No date: COLONOSCOPY No date: SURGERY OF LIP No date: TUBAL LIGATION  BMI    Body Mass Index: 34.70 kg/m      Reproductive/Obstetrics negative OB ROS                             Anesthesia Physical Anesthesia Plan  ASA: III  Anesthesia Plan: General   Post-op Pain Management:    Induction:   PONV Risk Score and Plan: Propofol infusion and TIVA  Airway Management Planned: Natural Airway and Simple Face Mask  Additional Equipment:   Intra-op Plan:   Post-operative Plan:   Informed Consent: I have reviewed the patients History and Physical, chart, labs and discussed the procedure including the risks, benefits and alternatives for the proposed anesthesia with the patient or authorized representative who has indicated his/her understanding and acceptance.     Dental Advisory Given  Plan Discussed with: Anesthesiologist, CRNA and Surgeon  Anesthesia Plan Comments:          Anesthesia Quick Evaluation

## 2019-05-16 NOTE — Transfer of Care (Signed)
Immediate Anesthesia Transfer of Care Note  Patient: Stephanie Nicholson  Procedure(s) Performed: COLONOSCOPY WITH PROPOFOL (N/A )  Patient Location: PACU  Anesthesia Type:General  Level of Consciousness: drowsy  Airway & Oxygen Therapy: Patient Spontanous Breathing  Post-op Assessment: Report given to RN and Post -op Vital signs reviewed and stable  Post vital signs: stable  Last Vitals:  Vitals Value Taken Time  BP 105/52 05/16/19 1204  Temp    Pulse 60 05/16/19 1204  Resp 22 05/16/19 1207  SpO2 95 % 05/16/19 1204  Vitals shown include unvalidated device data.  Last Pain:  Vitals:   05/16/19 1023  TempSrc: Temporal  PainSc: 0-No pain         Complications: No apparent anesthesia complications

## 2019-05-17 ENCOUNTER — Encounter: Payer: Self-pay | Admitting: Gastroenterology

## 2019-06-13 ENCOUNTER — Other Ambulatory Visit: Payer: Self-pay | Admitting: Family Medicine

## 2019-06-13 DIAGNOSIS — Z1231 Encounter for screening mammogram for malignant neoplasm of breast: Secondary | ICD-10-CM

## 2019-06-27 ENCOUNTER — Ambulatory Visit
Admission: RE | Admit: 2019-06-27 | Discharge: 2019-06-27 | Disposition: A | Discharge status: Home / Self Care | Payer: Medicare Other | Source: Ambulatory Visit | Attending: Family Medicine | Admitting: Family Medicine

## 2019-06-27 DIAGNOSIS — Z1231 Encounter for screening mammogram for malignant neoplasm of breast: Secondary | ICD-10-CM

## 2019-07-11 ENCOUNTER — Ambulatory Visit (INDEPENDENT_AMBULATORY_CARE_PROVIDER_SITE_OTHER): Payer: Medicare Other | Admitting: Psychology

## 2019-07-11 DIAGNOSIS — F3189 Other bipolar disorder: Secondary | ICD-10-CM

## 2019-07-25 ENCOUNTER — Ambulatory Visit: Payer: Medicare Other | Admitting: Psychology

## 2019-08-22 ENCOUNTER — Ambulatory Visit: Admit: 2019-08-22 | Payer: Medicare Other | Admitting: Internal Medicine

## 2019-08-22 SURGERY — COLONOSCOPY WITH PROPOFOL
Anesthesia: General

## 2019-11-02 ENCOUNTER — Ambulatory Visit (INDEPENDENT_AMBULATORY_CARE_PROVIDER_SITE_OTHER): Payer: Medicare Other | Admitting: Psychology

## 2019-11-02 DIAGNOSIS — F3189 Other bipolar disorder: Secondary | ICD-10-CM | POA: Diagnosis not present

## 2020-06-16 ENCOUNTER — Other Ambulatory Visit: Payer: Self-pay

## 2020-06-16 ENCOUNTER — Ambulatory Visit: Payer: Medicaid Other | Attending: Internal Medicine

## 2020-06-16 DIAGNOSIS — Z23 Encounter for immunization: Secondary | ICD-10-CM

## 2020-06-16 NOTE — Progress Notes (Signed)
   Covid-19 Vaccination Clinic  Name:  JOSPHINE LAFFEY    MRN: [id number] DOB: [birth year]  06/16/2020  Ms. Valbuena was observed post Covid-19 immunization for 15 minutes without incident. She was provided with Vaccine Information Sheet and instruction to access the V-Safe system.   Ms. Riesen was instructed to call 911 with any severe reactions post vaccine: Marland Kitchen Difficulty breathing  . Swelling of face and throat  . A fast heartbeat  . A bad rash all over body  . Dizziness and weakness   Immunizations Administered    Name Date Dose VIS Date Route   Pfizer COVID-19 Vaccine 06/16/2020  1:24 PM 0.3 mL 04/16/2020 Intramuscular   Manufacturer: [employer], [employer]   Lot: [id number]   NDC: [address]-4

## 2020-08-12 ENCOUNTER — Other Ambulatory Visit: Payer: Self-pay | Admitting: Family Medicine

## 2020-08-12 DIAGNOSIS — Z1231 Encounter for screening mammogram for malignant neoplasm of breast: Secondary | ICD-10-CM

## 2020-09-08 ENCOUNTER — Other Ambulatory Visit: Payer: Self-pay

## 2020-09-08 ENCOUNTER — Ambulatory Visit
Admission: RE | Admit: 2020-09-08 | Discharge: 2020-09-08 | Disposition: A | Discharge status: Home / Self Care | Payer: Medicare Other | Source: Ambulatory Visit | Attending: Family Medicine | Admitting: Family Medicine

## 2020-09-08 DIAGNOSIS — Z1231 Encounter for screening mammogram for malignant neoplasm of breast: Secondary | ICD-10-CM | POA: Diagnosis not present

## 2021-04-29 ENCOUNTER — Encounter (HOSPITAL_COMMUNITY): Payer: Self-pay | Admitting: *Deleted

## 2021-04-29 ENCOUNTER — Other Ambulatory Visit: Payer: Self-pay

## 2021-04-29 ENCOUNTER — Emergency Department (HOSPITAL_COMMUNITY): Payer: Medicare Other

## 2021-04-29 ENCOUNTER — Emergency Department (HOSPITAL_COMMUNITY)
Admission: EM | Admit: 2021-04-29 | Discharge: 2021-04-30 | Disposition: A | Discharge status: Nursing Home (Includes ICF) | Payer: Medicare Other | Attending: Emergency Medicine | Admitting: Emergency Medicine

## 2021-04-29 DIAGNOSIS — E039 Hypothyroidism, unspecified: Secondary | ICD-10-CM | POA: Insufficient documentation

## 2021-04-29 DIAGNOSIS — Z79899 Other long term (current) drug therapy: Secondary | ICD-10-CM | POA: Insufficient documentation

## 2021-04-29 DIAGNOSIS — Z794 Long term (current) use of insulin: Secondary | ICD-10-CM | POA: Insufficient documentation

## 2021-04-29 DIAGNOSIS — N200 Calculus of kidney: Secondary | ICD-10-CM | POA: Diagnosis not present

## 2021-04-29 DIAGNOSIS — R1031 Right lower quadrant pain: Secondary | ICD-10-CM | POA: Diagnosis present

## 2021-04-29 DIAGNOSIS — Z7982 Long term (current) use of aspirin: Secondary | ICD-10-CM | POA: Insufficient documentation

## 2021-04-29 DIAGNOSIS — E119 Type 2 diabetes mellitus without complications: Secondary | ICD-10-CM | POA: Diagnosis not present

## 2021-04-29 DIAGNOSIS — Z7984 Long term (current) use of oral hypoglycemic drugs: Secondary | ICD-10-CM | POA: Diagnosis not present

## 2021-04-29 LAB — COMPREHENSIVE METABOLIC PANEL
ALT: 25 U/L (ref 0–44)
AST: 25 U/L (ref 15–41)
Albumin: 4.1 g/dL (ref 3.5–5.0)
Alkaline Phosphatase: 96 U/L (ref 38–126)
Anion gap: 10 (ref 5–15)
BUN: 25 mg/dL — ABNORMAL HIGH (ref 6–20)
CO2: 27 mmol/L (ref 22–32)
Calcium: 9.4 mg/dL (ref 8.9–10.3)
Chloride: 103 mmol/L (ref 98–111)
Creatinine, Ser: 1.53 mg/dL — ABNORMAL HIGH (ref 0.44–1.00)
GFR, Estimated: 40 mL/min — ABNORMAL LOW (ref >60)
Glucose, Bld: 136 mg/dL — ABNORMAL HIGH (ref 70–99)
Potassium: 4.2 mmol/L (ref 3.5–5.1)
Sodium: 140 mmol/L (ref 135–145)
Total Bilirubin: 0.8 mg/dL (ref 0.3–1.2)
Total Protein: 7.4 g/dL (ref 6.5–8.1)

## 2021-04-29 LAB — CBC WITH DIFFERENTIAL/PLATELET
Abs Immature Granulocytes: 0.05 10*3/uL (ref 0.00–0.07)
Basophils Absolute: 0 10*3/uL (ref 0.0–0.1)
Basophils Relative: 0 %
Eosinophils Absolute: 0.8 10*3/uL — ABNORMAL HIGH (ref 0.0–0.5)
Eosinophils Relative: 8 %
HCT: 39.5 % (ref 36.0–46.0)
Hemoglobin: 13.3 g/dL (ref 12.0–15.0)
Immature Granulocytes: 1 %
Lymphocytes Relative: 26 %
Lymphs Abs: 2.5 10*3/uL (ref 0.7–4.0)
MCH: 31.1 pg (ref 26.0–34.0)
MCHC: 33.7 g/dL (ref 30.0–36.0)
MCV: 92.3 fL (ref 80.0–100.0)
Monocytes Absolute: 0.7 10*3/uL (ref 0.1–1.0)
Monocytes Relative: 7 %
Neutro Abs: 5.4 10*3/uL (ref 1.7–7.7)
Neutrophils Relative %: 58 %
Platelets: 197 10*3/uL (ref 150–400)
RBC: 4.28 MIL/uL (ref 3.87–5.11)
RDW: 13.2 % (ref 11.5–15.5)
WBC: 9.4 10*3/uL (ref 4.0–10.5)
nRBC: 0 % (ref 0.0–0.2)

## 2021-04-29 LAB — URINALYSIS, ROUTINE W REFLEX MICROSCOPIC
Bilirubin Urine: NEGATIVE
Glucose, UA: NEGATIVE mg/dL
Hgb urine dipstick: NEGATIVE
Ketones, ur: NEGATIVE mg/dL
Nitrite: NEGATIVE
Protein, ur: NEGATIVE mg/dL
Specific Gravity, Urine: 1.014 (ref 1.005–1.030)
pH: 5 (ref 5.0–8.0)

## 2021-04-29 LAB — LIPASE, BLOOD: Lipase: 35 U/L (ref 11–51)

## 2021-04-29 MED ORDER — IOHEXOL 300 MG/ML  SOLN
75.0000 mL | Freq: Once | INTRAMUSCULAR | Status: AC | PRN
  Administered 2021-04-29: 75 mL via INTRAVENOUS

## 2021-04-29 NOTE — Discharge Instructions (Signed)
We saw you in the ER for right-sided abdominal pain. All the results in the ER are normal, labs and imaging. We are not sure what is causing your symptoms. The workup in the ER is not complete, and is limited to screening for life threatening and emergent conditions only, so please see a primary care doctor for further evaluation.

## 2021-04-29 NOTE — ED Notes (Signed)
Patient transported to CT 

## 2021-04-29 NOTE — ED Triage Notes (Signed)
Pt brought in by [facility] from [facility] with c/o right lower back pain radiating around to right abdomen x 1 month. Denies n/v/d. Per EMS vitals are BP 159/71, HR 82, O2 sat 97% RA and CBG 162.

## 2021-04-29 NOTE — ED Provider Notes (Signed)
[facility] EMERGENCY DEPARTMENT Provider Note   CSN: [id number] Arrival date & time: 04/29/21  1819     History Chief Complaint  Patient presents with   Abdominal Pain    GAYNOR GENCO is a 55 y.o. female.  HPI     55 year old female with history of diabetes, schizoaffective disorder, hyperlipidemia comes in with chief complaint of right-sided pain.  Patient currently living at [facility] assisted living.  She reports that she has had some right posterior, flank region pain for the last month or so.  Today however she is having pain in the right lower quadrant of the abdomen.  Providers at the facility were concerned for appendicitis and she was sent to the ER.  She denies any UTI-like symptoms and denies any vaginal discharge or bleeding.  No diarrhea, no vomiting.  Past Medical History:  Diagnosis Date   Diabetes mellitus without complication (HCC)    Hyperlipemia    Hypothyroidism    Renal disorder    Schizo affective schizophrenia [facility])     Patient Active Problem List   Diagnosis Date Noted   Hypothyroidism 03/11/2018   Hyperlipemia 03/11/2018   Schizoaffective disorder, bipolar type (HCC) 03/10/2018   Schizo affective schizophrenia (HCC) 03/08/2018   Diabetes mellitus without complication (HCC) 03/08/2018   Agitation 03/08/2018    Past Surgical History:  Procedure Laterality Date   COLONOSCOPY     COLONOSCOPY WITH PROPOFOL N/A 05/16/2019   Procedure: COLONOSCOPY WITH PROPOFOL;  Surgeon: Pasty Spillers, MD;  Location: [facility] ENDOSCOPY;  Service: Endoscopy;  Laterality: N/A;   SURGERY OF LIP     TUBAL LIGATION       OB History   No obstetric history on file.     Family History  Problem Relation Age of Onset   Breast cancer Neg Hx     Social History   Tobacco Use   Smoking status: Never   Smokeless tobacco: Never  Vaping Use   Vaping Use: Never used  Substance Use Topics   Alcohol use: Not Currently   Drug use: Not Currently    Home  Medications Prior to Admission medications   Medication Sig Start Date End Date Taking? Authorizing Provider  amantadine (SYMMETREL) 100 MG capsule Take 1 capsule (100 mg total) by mouth 2 (two) times daily. 03/15/18  Yes Pucilowska, Jolanta B, MD  ARIPiprazole (ABILIFY) 15 MG tablet Take 30 mg by mouth daily.   Yes [provider]  aspirin 81 MG chewable tablet Chew 81 mg by mouth daily.   Yes [provider]  atorvastatin (LIPITOR) 10 MG tablet Take 10 mg by mouth daily.   Yes [provider]  cholecalciferol (VITAMIN D) 1000 units tablet Take 1,000 Units by mouth daily.   Yes [provider]  fenofibrate (TRICOR) 48 MG tablet Take 40 mg by mouth daily.   Yes [provider]  furosemide (LASIX) 40 MG tablet Take 40 mg by mouth daily.   Yes [provider]  gabapentin (NEURONTIN) 300 MG capsule Take 600 mg by mouth 2 (two) times daily.   Yes [provider]  haloperidol (HALDOL) 1 MG tablet Take 1 mg by mouth 2 (two) times daily.   Yes [provider]  insulin glargine (LANTUS) 100 UNIT/ML injection Inject 0.14 mLs (14 Units total) into the skin at bedtime. Patient taking differently: Inject 20 Units into the skin 2 (two) times daily. 03/15/18  Yes Pucilowska, Jolanta B, MD  lamoTRIgine (LAMICTAL) 200 MG tablet Take 200  mg by mouth daily.   Yes [provider]  levothyroxine (SYNTHROID) 75 MCG tablet Take by mouth. 06/13/18  Yes [provider]  linaclotide (LINZESS) 72 MCG capsule Take 72 mcg by mouth daily before breakfast. 04/09/19  Yes [provider]  LORazepam (ATIVAN) 0.5 MG tablet Take 0.5 mg by mouth daily.   Yes [provider]  metFORMIN (GLUCOPHAGE) 500 MG tablet Take 500 mg by mouth 2 (two) times daily with a meal.   Yes [provider]  glipiZIDE (GLUCOTROL) 5 MG tablet Take 2.5 mg by mouth daily before breakfast.    [provider]  levothyroxine (SYNTHROID,  LEVOTHROID) 25 MCG tablet Take 75 mcg by mouth daily before breakfast. Patient not taking: Reported on 04/29/2021    [provider]    Allergies    Kiwi extract  Review of Systems   Review of Systems  Constitutional:  Positive for activity change.  Gastrointestinal:  Positive for abdominal pain.  All other systems reviewed and are negative.  Physical Exam Updated Vital Signs BP (!) 109/56   Pulse 74   Temp 97.6 F (36.4 C) (Oral)   Resp (!) 22   Ht 5\' 6"  (1.676 m)   Wt 113.4 kg   SpO2 99%   BMI 40.35 kg/m   Physical Exam Vitals and nursing note reviewed.  Constitutional:      Appearance: She is well-developed.  HENT:     Head: Atraumatic.  Cardiovascular:     Rate and Rhythm: Normal rate.  Pulmonary:     Effort: Pulmonary effort is normal.  Abdominal:     Tenderness: There is abdominal tenderness in the right lower quadrant. There is guarding. Positive signs include McBurney's sign.  Musculoskeletal:     Cervical back: Normal range of motion and neck supple.  Skin:    General: Skin is warm and dry.  Neurological:     Mental Status: She is alert and oriented to person, place, and time.    ED Results / Procedures / Treatments   Labs (all labs ordered are listed, but only abnormal results are displayed) Labs Reviewed  COMPREHENSIVE METABOLIC PANEL - Abnormal; Notable for the following components:      Result Value   Glucose, Bld 136 (*)    BUN 25 (*)    Creatinine, Ser 1.53 (*)    GFR, Estimated 40 (*)    All other components within normal limits  CBC WITH DIFFERENTIAL/PLATELET - Abnormal; Notable for the following components:   Eosinophils Absolute 0.8 (*)    All other components within normal limits  URINALYSIS, ROUTINE W REFLEX MICROSCOPIC - Abnormal; Notable for the following components:   APPearance HAZY (*)    Leukocytes,Ua LARGE (*)    Bacteria, UA RARE (*)    All other components within normal limits  LIPASE, BLOOD     EKG None  Radiology CT ABDOMEN PELVIS W CONTRAST  Result Date: 04/29/2021 CLINICAL DATA:  RLQ abdominal pain, appendicitis suspected (Age >= 14y). EXAM: CT ABDOMEN AND PELVIS WITH CONTRAST TECHNIQUE: Multidetector CT imaging of the abdomen and pelvis was performed using the standard protocol following bolus administration of intravenous contrast. CONTRAST:  57mL OMNIPAQUE IOHEXOL 300 MG/ML  SOLN COMPARISON:  None. FINDINGS: Lower chest: No acute abnormality. Hepatobiliary: No focal liver abnormality is seen. No gallstones, gallbladder wall thickening, or biliary dilatation. Pancreas: Unremarkable Spleen: Unremarkable Adrenals/Urinary Tract: The adrenal glands are unremarkable. The kidneys are normal in size and position. 3 mm nonobstructing calculus noted  within the lower pole the left kidney. The kidneys are otherwise unremarkable. The bladder is unremarkable. Stomach/Bowel: Stomach is within normal limits. Appendix appears normal. No evidence of bowel wall thickening, distention, or inflammatory changes. Vascular/Lymphatic: No significant vascular findings are present. No enlarged abdominal or pelvic lymph nodes. Reproductive: Uterus and bilateral adnexa are unremarkable. Other: No abdominal wall hernia.  The rectum is unremarkable. Musculoskeletal: Degenerative changes are seen within the lumbosacral and thoracolumbar junctions. No acute bone abnormality. No lytic or blastic bone lesion. IMPRESSION: No acute intra-abdominal pathology identified. No definite radiographic explanation for the patient's reported symptoms. Minimal left nonobstructing nephrolithiasis. Electronically Signed   By: Helyn Numbers M.D.   On: 04/29/2021 22:34    Procedures Procedures   Medications Ordered in ED Medications  iohexol (OMNIPAQUE) 300 MG/ML solution 75 mL (75 mLs Intravenous Contrast Given 04/29/21 2215)    ED Course  I have reviewed the triage vital signs and the nursing notes.  Pertinent labs & imaging  results that were available during my care of the patient were reviewed by me and considered in my medical decision making (see chart for details).    MDM Rules/Calculators/A&P                           55 year old female history of diabetes, hyperlipidemia comes in with chief complaint of right-sided abdominal pain.  She used to be having flank pain for the last month, that appears to be musculoskeletal.  Today however she has right lower quadrant abdominal tenderness.  On exam, there is concerns for McBurney's and CT abdomen pelvis with contrast ordered.  CT scan is negative for acute findings.  Patient made aware of the findings, stable for discharge.  No clinical concerns for UTI or pyelonephritis.  Final Clinical Impression(s) / ED Diagnoses Final diagnoses:  Right lower quadrant abdominal pain    Rx / DC Orders ED Discharge Orders     None        Derwood Kaplan, MD 04/29/21 2323

## 2022-01-15 ENCOUNTER — Ambulatory Visit (INDEPENDENT_AMBULATORY_CARE_PROVIDER_SITE_OTHER): Payer: Medicare Other | Admitting: Podiatry

## 2022-01-15 DIAGNOSIS — Z91199 Patient's noncompliance with other medical treatment and regimen due to unspecified reason: Secondary | ICD-10-CM

## 2022-01-15 NOTE — Progress Notes (Signed)
No show

## 2022-06-23 ENCOUNTER — Other Ambulatory Visit: Payer: Self-pay

## 2022-06-23 ENCOUNTER — Emergency Department (HOSPITAL_BASED_OUTPATIENT_CLINIC_OR_DEPARTMENT_OTHER)
Admission: EM | Admit: 2022-06-23 | Discharge: 2022-06-23 | Disposition: A | Discharge status: Skilled Nursing Facility | Payer: Medicare Other | Attending: Emergency Medicine | Admitting: Emergency Medicine

## 2022-06-23 ENCOUNTER — Emergency Department (HOSPITAL_BASED_OUTPATIENT_CLINIC_OR_DEPARTMENT_OTHER): Payer: Medicare Other

## 2022-06-23 ENCOUNTER — Encounter (HOSPITAL_BASED_OUTPATIENT_CLINIC_OR_DEPARTMENT_OTHER): Payer: Self-pay

## 2022-06-23 DIAGNOSIS — E039 Hypothyroidism, unspecified: Secondary | ICD-10-CM | POA: Diagnosis not present

## 2022-06-23 DIAGNOSIS — Z7984 Long term (current) use of oral hypoglycemic drugs: Secondary | ICD-10-CM | POA: Insufficient documentation

## 2022-06-23 DIAGNOSIS — Z20822 Contact with and (suspected) exposure to covid-19: Secondary | ICD-10-CM | POA: Insufficient documentation

## 2022-06-23 DIAGNOSIS — N189 Chronic kidney disease, unspecified: Secondary | ICD-10-CM | POA: Diagnosis not present

## 2022-06-23 DIAGNOSIS — R509 Fever, unspecified: Secondary | ICD-10-CM | POA: Diagnosis present

## 2022-06-23 DIAGNOSIS — J101 Influenza due to other identified influenza virus with other respiratory manifestations: Secondary | ICD-10-CM | POA: Diagnosis not present

## 2022-06-23 DIAGNOSIS — Z794 Long term (current) use of insulin: Secondary | ICD-10-CM | POA: Diagnosis not present

## 2022-06-23 DIAGNOSIS — E1122 Type 2 diabetes mellitus with diabetic chronic kidney disease: Secondary | ICD-10-CM | POA: Diagnosis not present

## 2022-06-23 LAB — RESP PANEL BY RT-PCR (RSV, FLU A&B, COVID)  RVPGX2
Influenza A by PCR: POSITIVE — AB
Influenza B by PCR: NEGATIVE
Resp Syncytial Virus by PCR: NEGATIVE
SARS Coronavirus 2 by RT PCR: NEGATIVE

## 2022-06-23 MED ORDER — OSELTAMIVIR PHOSPHATE 75 MG PO CAPS
75.0000 mg | ORAL_CAPSULE | Freq: Once | ORAL | Status: AC
Start: 2022-06-23 — End: 2022-06-23
  Administered 2022-06-23: 75 mg via ORAL
  Filled 2022-06-23: qty 1

## 2022-06-23 MED ORDER — IBUPROFEN 800 MG PO TABS
800.0000 mg | ORAL_TABLET | Freq: Once | ORAL | Status: AC
  Administered 2022-06-23: 800 mg via ORAL
  Filled 2022-06-23: qty 1

## 2022-06-23 MED ORDER — OSELTAMIVIR PHOSPHATE 75 MG PO CAPS: 75.0000 mg | ORAL_CAPSULE | Freq: Two times a day (BID) | ORAL | 0 refills | Status: AC

## 2022-06-23 NOTE — ED Notes (Signed)
Group home receives report- Anitha. Will relay to guardian as he has been unreachable today. Papers sent with [facility]. Pt transfers to [facility] stretcher without incident and is seen exiting department with crew.

## 2022-06-23 NOTE — ED Provider Notes (Signed)
[facility] EMERGENCY DEPT Provider Note   CSN: [id number] Arrival date & time: 06/23/22  1339     History  Chief Complaint  Patient presents with   Cough    Stephanie Nicholson is a 56 y.o. female.  Pt is a 56 yo female with pmhx significant for schizoaffective d/o, dm, hld, hypothyroidsm, and CKD.  Pt said she developed a fever and cough today.  She lives in a group home who called EMS to take her to the hospital.  Pt denies any current sob.        Home Medications Prior to Admission medications   Medication Sig Start Date End Date Taking? Authorizing Provider  oseltamivir (TAMIFLU) 75 MG capsule Take 1 capsule (75 mg total) by mouth every 12 (twelve) hours. 06/23/22  Yes Jacalyn Lefevre, MD  amantadine (SYMMETREL) 100 MG capsule Take 1 capsule (100 mg total) by mouth 2 (two) times daily. 03/15/18   Pucilowska, Jolanta B, MD  ARIPiprazole (ABILIFY) 15 MG tablet Take 30 mg by mouth daily.    [provider]  aspirin 81 MG chewable tablet Chew 81 mg by mouth daily.    [provider]  atorvastatin (LIPITOR) 10 MG tablet Take 10 mg by mouth daily.    [provider]  cholecalciferol (VITAMIN D) 1000 units tablet Take 1,000 Units by mouth daily.    [provider]  fenofibrate (TRICOR) 48 MG tablet Take 40 mg by mouth daily.    [provider]  furosemide (LASIX) 40 MG tablet Take 40 mg by mouth daily.    [provider]  gabapentin (NEURONTIN) 300 MG capsule Take 600 mg by mouth 2 (two) times daily.    [provider]  glipiZIDE (GLUCOTROL) 5 MG tablet Take 2.5 mg by mouth daily before breakfast.    [provider]  haloperidol (HALDOL) 1 MG tablet Take 1 mg by mouth 2 (two) times daily.    [provider]  insulin glargine (LANTUS) 100 UNIT/ML injection Inject 0.14 mLs (14 Units total) into the skin at bedtime. Patient taking differently: Inject 20 Units into the skin 2 (two) times daily.  03/15/18   Pucilowska, Braulio Conte B, MD  lamoTRIgine (LAMICTAL) 200 MG tablet Take 200 mg by mouth daily.    [provider]  levothyroxine (SYNTHROID) 75 MCG tablet Take by mouth. 06/13/18   [provider]  levothyroxine (SYNTHROID, LEVOTHROID) 25 MCG tablet Take 75 mcg by mouth daily before breakfast. Patient not taking: Reported on 04/29/2021    [provider]  linaclotide (LINZESS) 72 MCG capsule Take 72 mcg by mouth daily before breakfast. 04/09/19   [provider]  LORazepam (ATIVAN) 0.5 MG tablet Take 0.5 mg by mouth daily.    [provider]  metFORMIN (GLUCOPHAGE) 500 MG tablet Take 500 mg by mouth 2 (two) times daily with a meal.    [provider]      Allergies    Kiwi extract    Review of Systems   Review of Systems  Constitutional:  Positive for fever.  Respiratory:  Positive for cough.   All other systems reviewed and are negative.   Physical Exam Updated Vital Signs BP 105/81   Pulse 95   Temp 99.3 F (37.4 C)   Resp 18   Ht 5\' 6"  (1.676 m)   Wt 113.4 kg   SpO2 99%   BMI 40.35 kg/m  Physical Exam Vitals and nursing note reviewed.  Constitutional:  Appearance: Normal appearance. She is obese.  HENT:     Head: Normocephalic and atraumatic.     Right Ear: External ear normal.     Left Ear: External ear normal.     Nose: Nose normal.     Mouth/Throat:     Mouth: Mucous membranes are dry.  Eyes:     Extraocular Movements: Extraocular movements intact.     Conjunctiva/sclera: Conjunctivae normal.     Pupils: Pupils are equal, round, and reactive to light.  Cardiovascular:     Rate and Rhythm: Normal rate and regular rhythm.     Pulses: Normal pulses.     Heart sounds: Normal heart sounds.  Pulmonary:     Effort: Pulmonary effort is normal.     Breath sounds: Normal breath sounds.  Abdominal:     General: Abdomen is flat. Bowel sounds are normal.     Palpations: Abdomen is soft.  Musculoskeletal:         General: Normal range of motion.     Cervical back: Normal range of motion and neck supple.  Skin:    General: Skin is warm.     Capillary Refill: Capillary refill takes less than 2 seconds.  Neurological:     General: No focal deficit present.     Mental Status: She is alert and oriented to person, place, and time.  Psychiatric:        Mood and Affect: Mood normal.        Behavior: Behavior normal.     ED Results / Procedures / Treatments   Labs (all labs ordered are listed, but only abnormal results are displayed) Labs Reviewed  RESP PANEL BY RT-PCR (RSV, FLU A&B, COVID)  [id number] - Abnormal; Notable for the following components:      Result Value   Influenza A by PCR POSITIVE (*)    All other components within normal limits    EKG None  Radiology DG Chest Portable 1 View  Result Date: 06/23/2022 CLINICAL DATA:  Cough EXAM: PORTABLE CHEST 1 VIEW COMPARISON:  None Available. FINDINGS: Heart size upper limits of normal. Bronchovascular crowding in the lower lungs. No definite pleural effusion. No pneumothorax. No acute osseous abnormality. IMPRESSION: Bronchovascular crowding in the lower lungs be due to mild bronchitis. Electronically Signed   By: Placido Sou M.D.   On: 06/23/2022 19:34    Procedures Procedures    Medications Ordered in ED Medications  ibuprofen (ADVIL) tablet 800 mg (800 mg Oral Given 06/23/22 1945)  oseltamivir (TAMIFLU) capsule 75 mg (75 mg Oral Given 06/23/22 1945)    ED Course/ Medical Decision Making/ A&P                           Medical Decision Making Amount and/or Complexity of Data Reviewed Radiology: ordered.  Risk Prescription drug management.   This patient presents to the ED for concern of cough, this involves an extensive number of treatment options, and is a complaint that carries with it a high risk of complications and morbidity.  The differential diagnosis includes covid/flu/rsv/pna/bronchitis   Co morbidities  that complicate the patient evaluation  schizoaffective d/o, dm, hld, hypothyroidsm, and CKD   Additional history obtained:  Additional history obtained from epic chart review   Lab Tests:  I Ordered, and personally interpreted labs.  The pertinent results include:  Influenza A +   Imaging Studies ordered:  I ordered imaging studies including cxr  I independently  visualized and interpreted imaging which showed  Bronchovascular crowding in the lower lungs be due to mild  bronchitis.   I agree with the radiologist interpretation   Cardiac Monitoring:  The patient was maintained on a cardiac monitor.  I personally viewed and interpreted the cardiac monitored which showed an underlying rhythm of: nsr   Medicines ordered and prescription drug management:  I ordered medication including tamiflu and ibuprofen  for fever and flu  Reevaluation of the patient after these medicines showed that the patient improved I have reviewed the patients home medicines and have made adjustments as needed   Test Considered:  cxr   Problem List / ED Course:  Influenza A:  pt looks well.  She is not hypoxic.  She is stable for d/c.  She needs to return if worse.  F/u with pcp.   Reevaluation:  After the interventions noted above, I reevaluated the patient and found that they have :improved   Social Determinants of Health:  Lives in a group home   Dispostion:  After consideration of the diagnostic results and the patients response to treatment, I feel that the patent would benefit from discharge with outpatient f/u.          Final Clinical Impression(s) / ED Diagnoses Final diagnoses:  Influenza A    Rx / DC Orders ED Discharge Orders          Ordered    oseltamivir (TAMIFLU) 75 MG capsule  Every 12 hours        06/23/22 1946              Isla Pence, MD 06/23/22 1947

## 2022-06-23 NOTE — ED Provider Triage Note (Signed)
Emergency Medicine Provider Triage Evaluation Note  Stephanie Nicholson , a 56 y.o. female  was evaluated in triage.  Pt complains of fever and cough since this morning. Fever of 101. No known sick contact. No chest pain or shortness of breath. No other symptoms.  Review of Systems  Positive: As above Negative: As above  Physical Exam  BP 105/81   Pulse 95   Temp 99.3 F (37.4 C)   Resp 18   Ht 5\' 6"  (1.676 m)   Wt 113.4 kg   SpO2 99%   BMI 40.35 kg/m  Gen:   Awake, no distress   Resp:  Normal effort  MSK:   Moves extremities without difficulty  Other:    Medical Decision Making  Medically screening exam initiated at 2:27 PM.  Appropriate orders placed.  Stephanie Nicholson was informed that the remainder of the evaluation will be completed by another provider, this initial triage assessment does not replace that evaluation, and the importance of remaining in the ED until their evaluation is complete.     Catalina Lunger, Jeanelle Malling 06/23/22 1428

## 2022-06-23 NOTE — ED Notes (Signed)
For Transportation: [phone] Dorthula Perfect)

## 2022-06-23 NOTE — ED Triage Notes (Signed)
Patient BIB [facility] from Group Home.  Endorses Fever and Mild Cough that began today.  NAD Noted during Triage. A&Ox4. GCS 15. Ambulatory.

## 2022-11-12 IMAGING — MG MM DIGITAL SCREENING BILAT W/ TOMO AND CAD
6 of 12 series · 6 of 36 positions shown · non-contrast
Comparison: Previous exam(s).

CLINICAL DATA: Screening.

EXAM:
DIGITAL SCREENING BILATERAL MAMMOGRAM WITH TOMOSYNTHESIS AND CAD
TECHNIQUE: Bilateral screening digital craniocaudal and mediolateral oblique
mammograms were obtained. Bilateral screening digital breast
tomosynthesis was performed. The images were evaluated with
computer-aided detection.

[R CC synth-2D (1 of 2)]
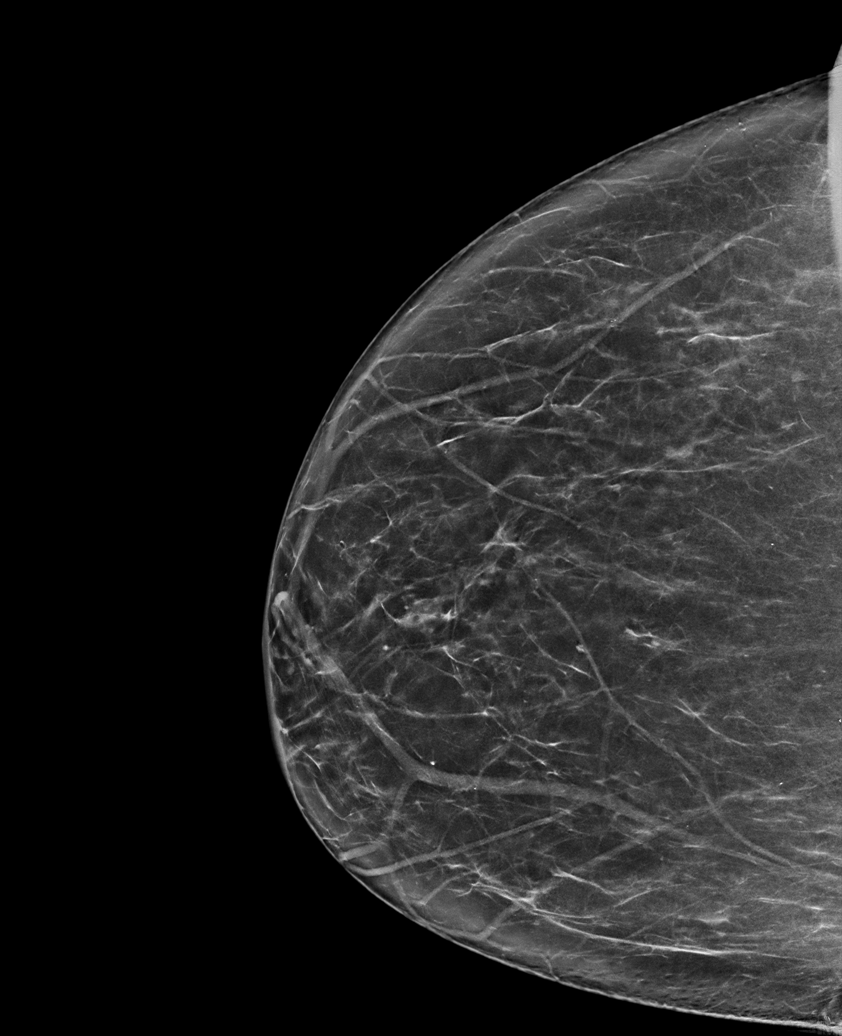

[L CC synth-2D (1 of 2)]
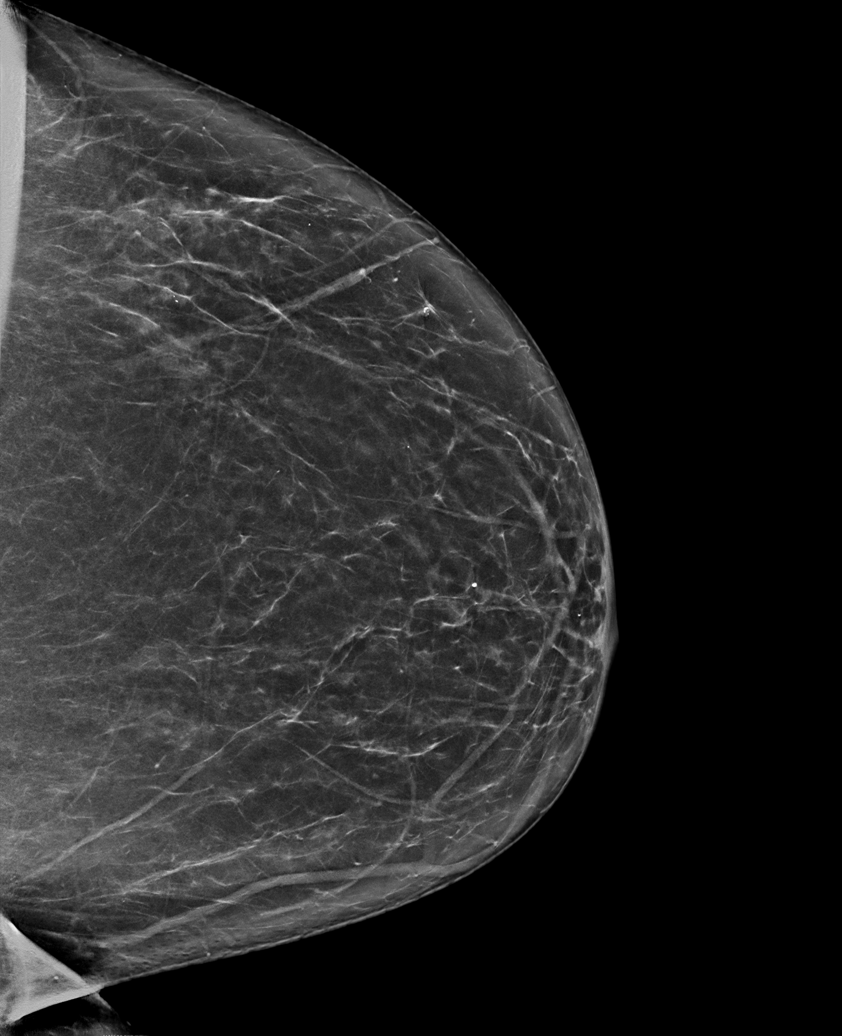

[L MLO synth-2D]
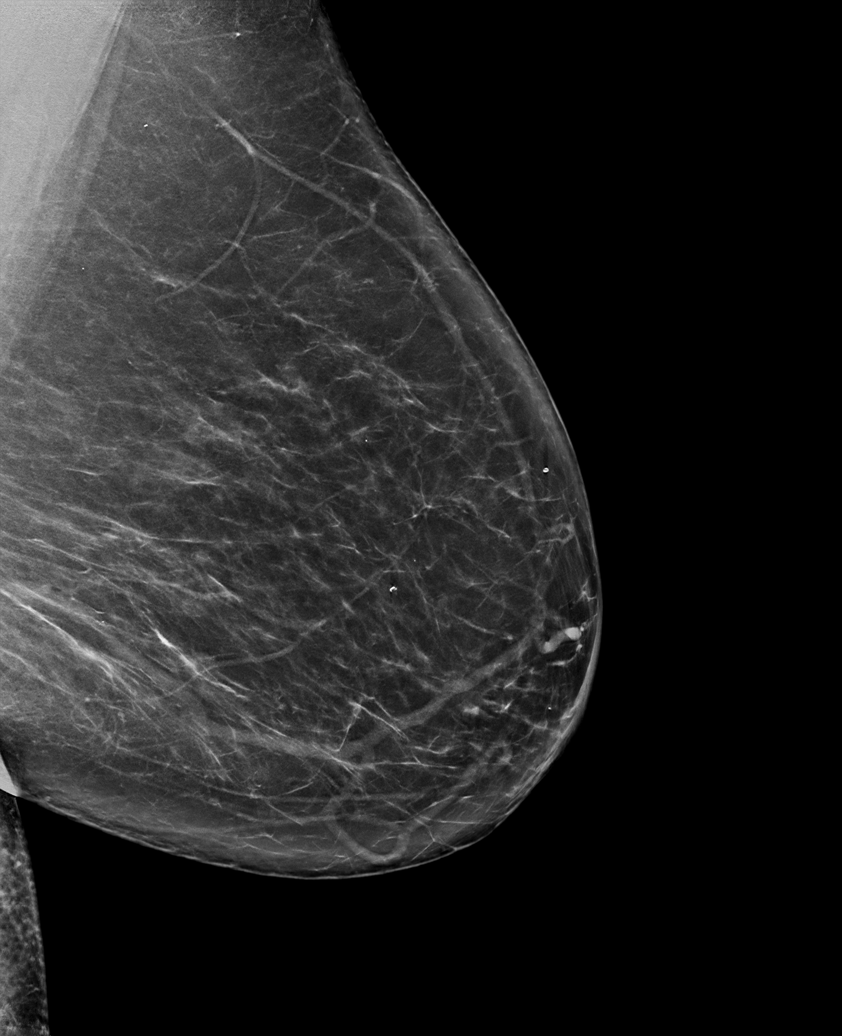

[L CC synth-2D (2 of 2)]
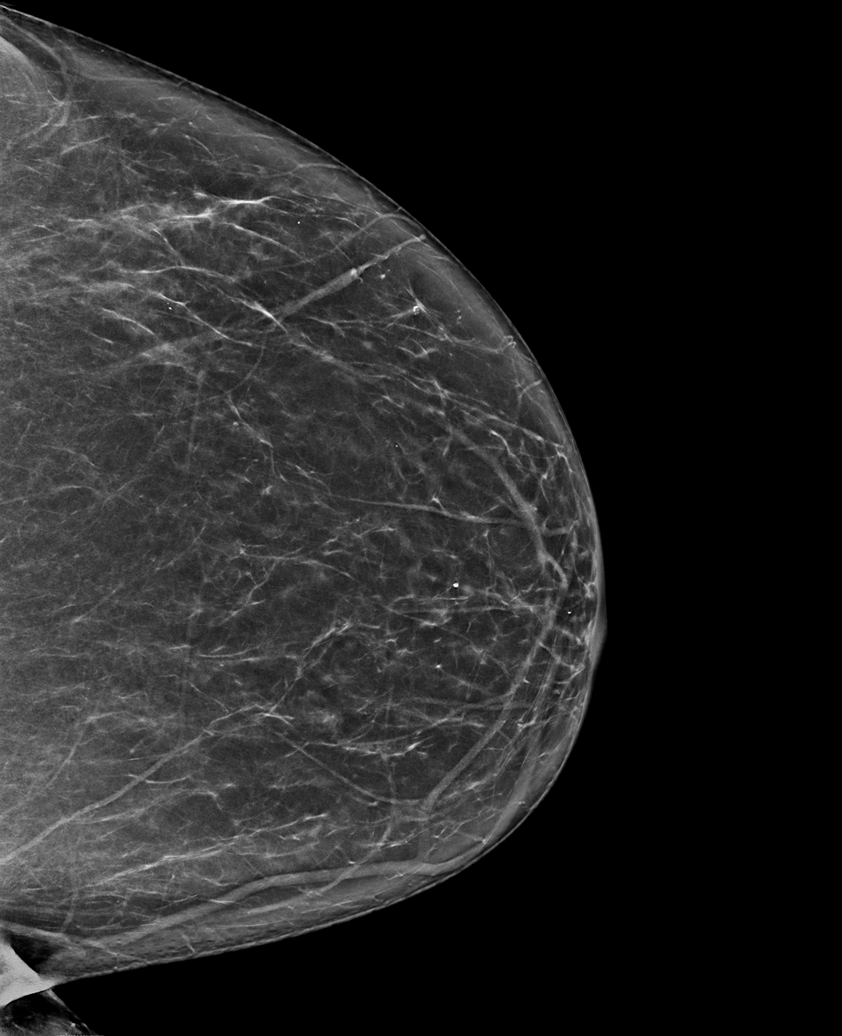

[R MLO synth-2D]
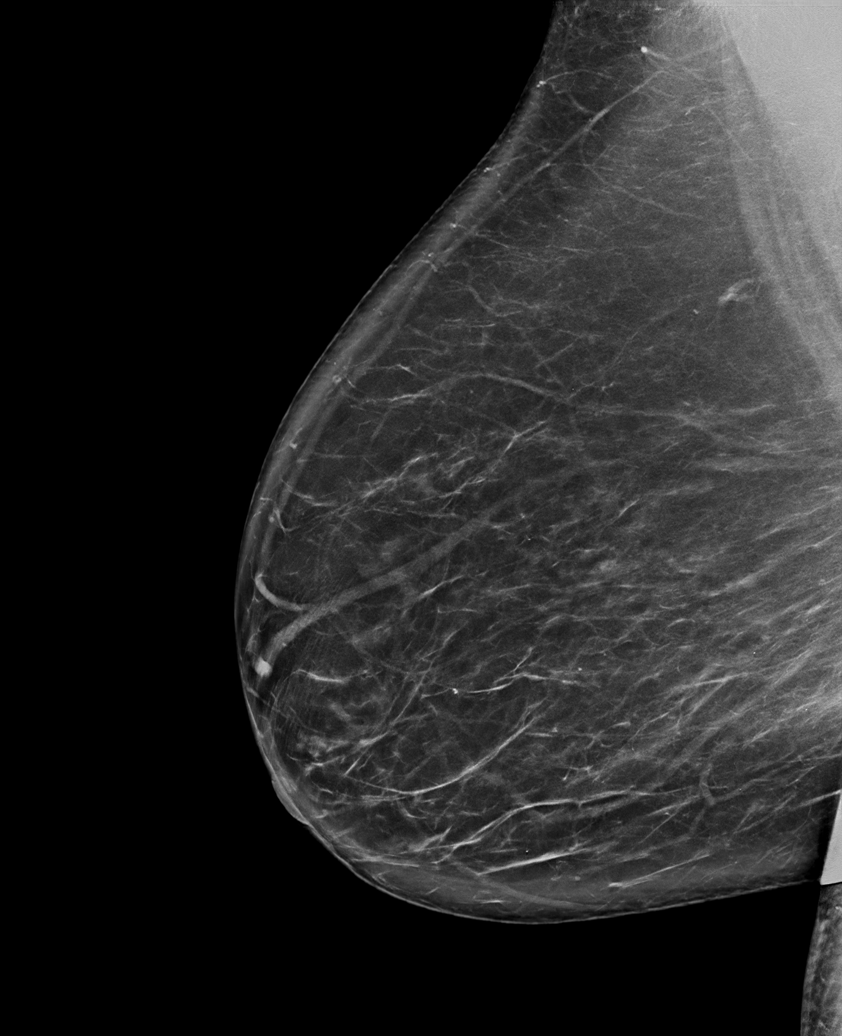

[R CC synth-2D (2 of 2)]
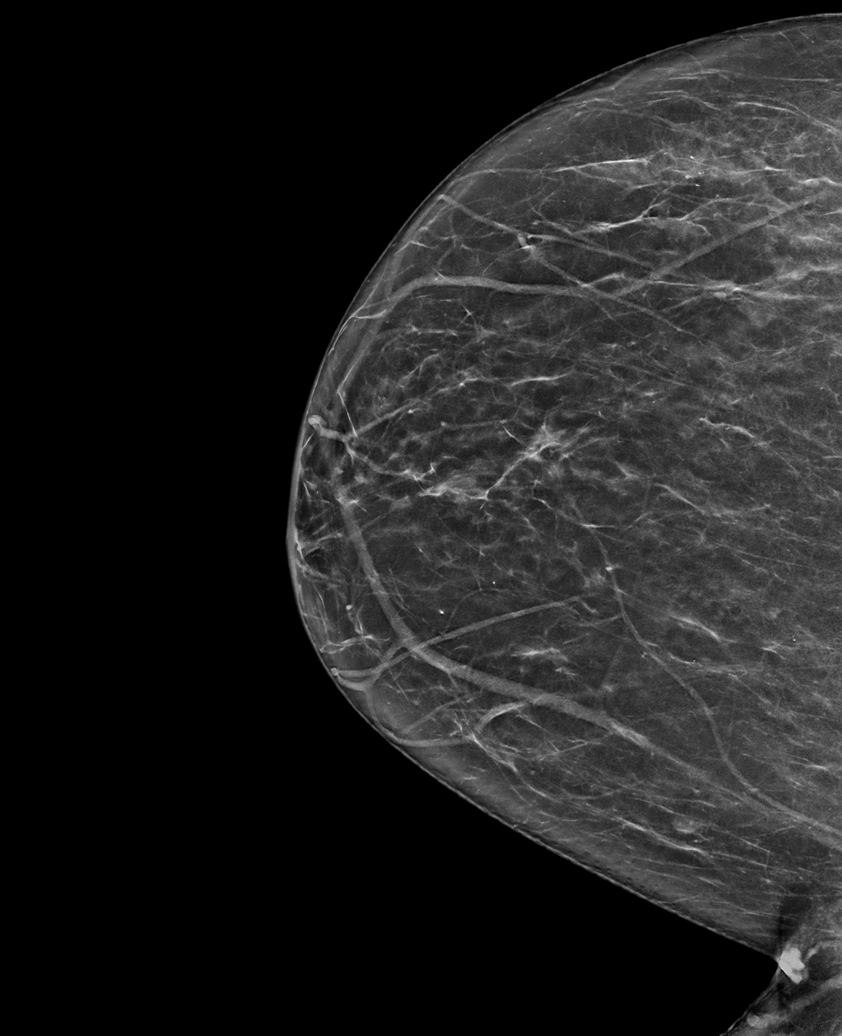

[6 of 36 positions shown; findings below may reference images not displayed]

ACR Breast Density Category b: There are scattered areas of
fibroglandular density.
FINDINGS: There are no findings suspicious for malignancy. The images were
evaluated with computer-aided detection.
IMPRESSION: No mammographic evidence of malignancy. A result letter of this
screening mammogram will be mailed directly to the patient.

RECOMMENDATION:
Screening mammogram in one year. (Code:WJ-I-BG6)

BI-RADS CATEGORY  1: Negative.

## 2023-07-03 IMAGING — CT CT ABD-PELV W/ CM
2 of 5 series · 16 of 46 positions shown, 18 images · IV contrast (Omnipaque or Isovue)
Comparison: None.

CLINICAL DATA: RLQ abdominal pain, appendicitis suspected (Age >=
14y).

EXAM:
CT ABDOMEN AND PELVIS WITH CONTRAST
TECHNIQUE: Multidetector CT imaging of the abdomen and pelvis was performed
using the standard protocol following bolus administration of
intravenous contrast.
CONTRAST:  75mL OMNIPAQUE IOHEXOL 300 MG/ML  SOLN

[Series 2: axial st · axial · 0.98mm/px · z∈[+884,+1349]mm · 13 of 105 slices shown, 15 images]
[im 6/105  soft-tissue]
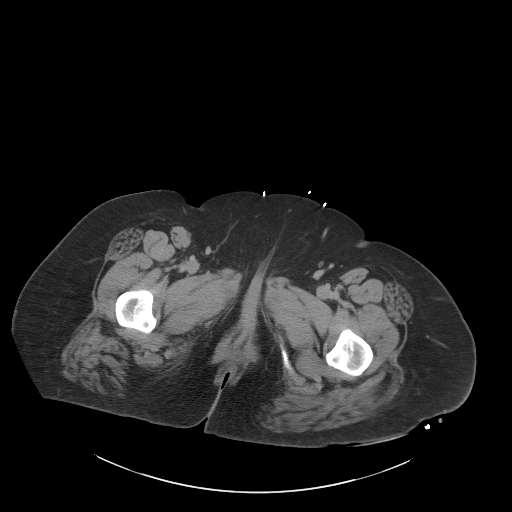
[im 6/105  bone]
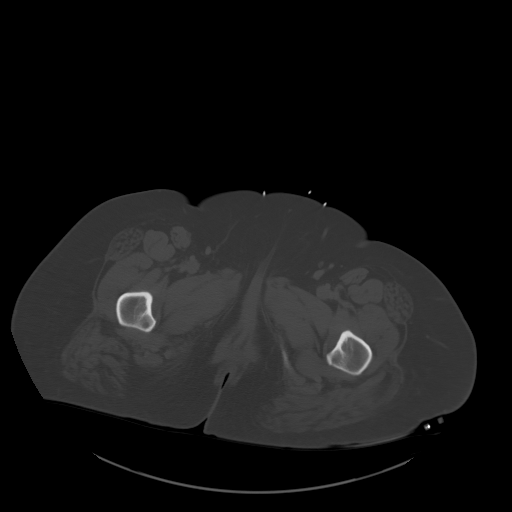
[im 17/105  soft-tissue]
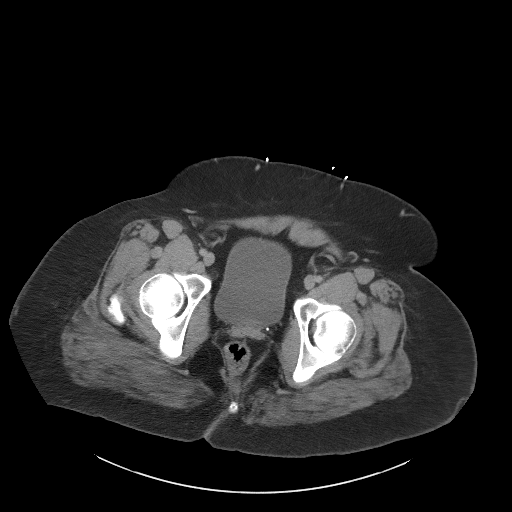
[im 22/105  soft-tissue]
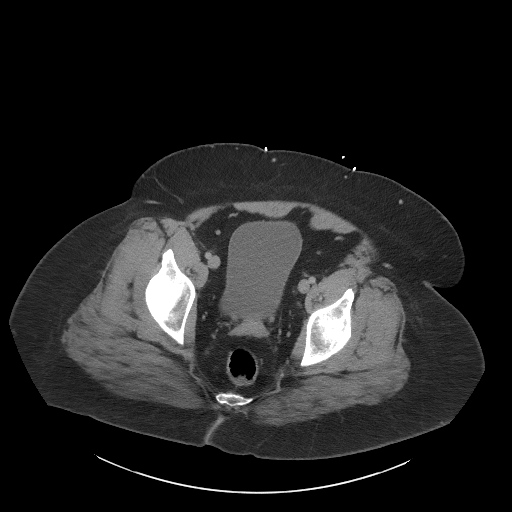
[im 28/105  soft-tissue]
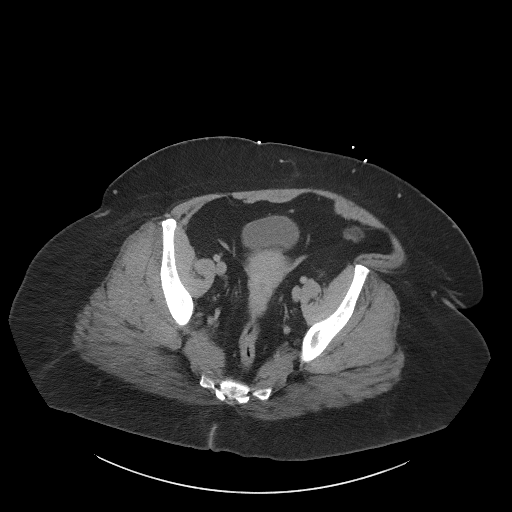
[im 39/105  soft-tissue]
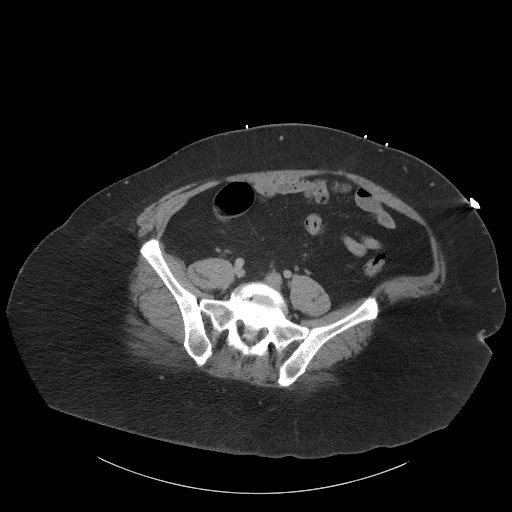
[im 44/105  soft-tissue]
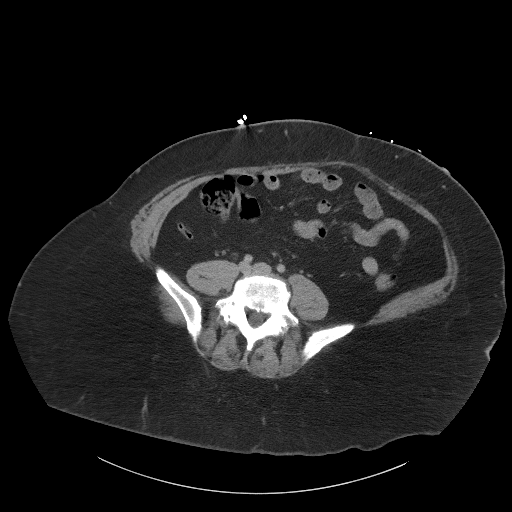
[im 55/105  soft-tissue]
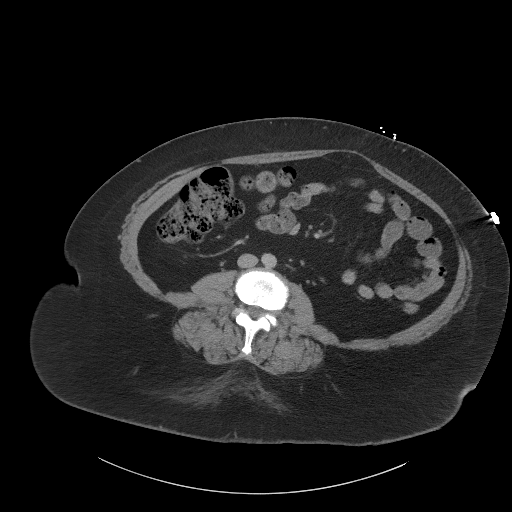
[im 61/105  soft-tissue]
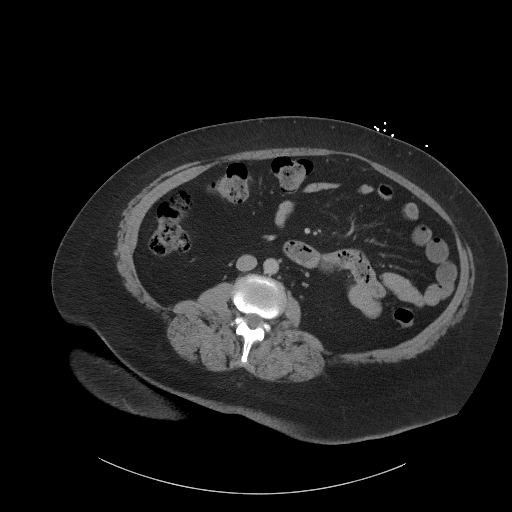
[im 66/105  soft-tissue]
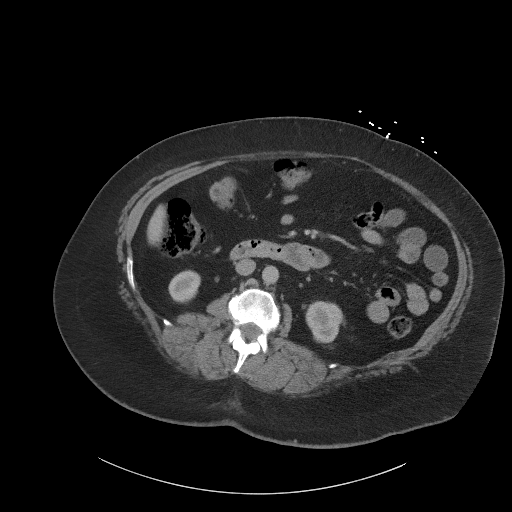
[im 66/105  bone]
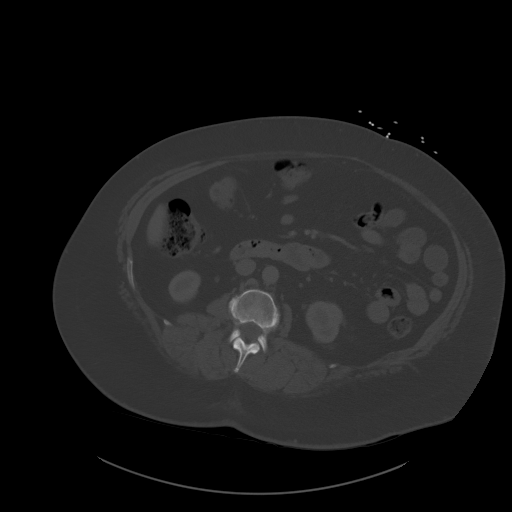
[im 77/105  soft-tissue]
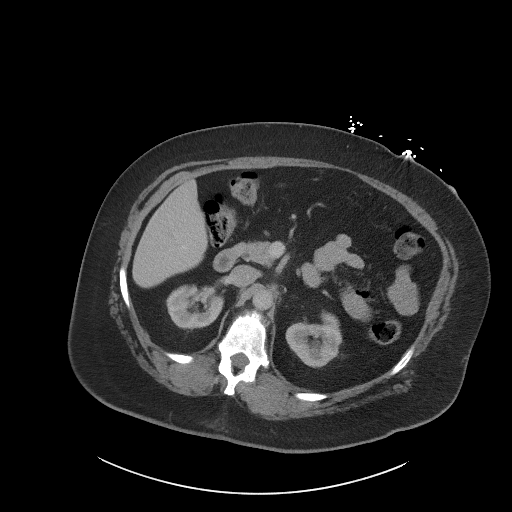
[im 83/105  soft-tissue]
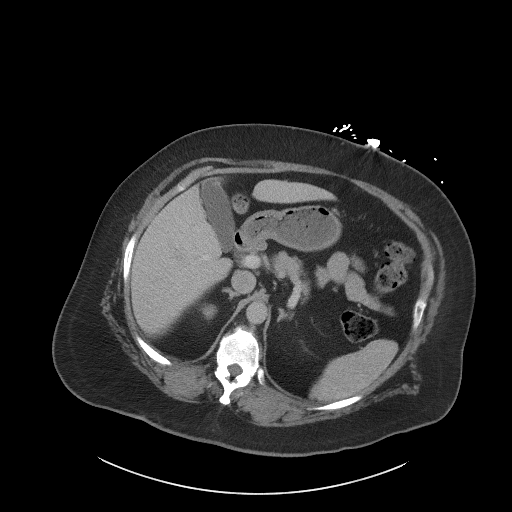
[im 88/105  soft-tissue]
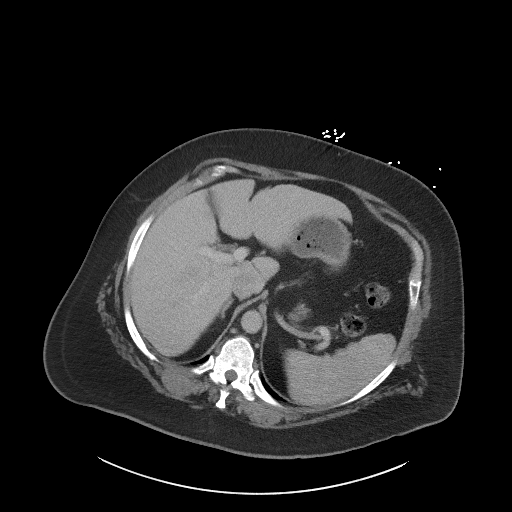
[im 99/105  soft-tissue]
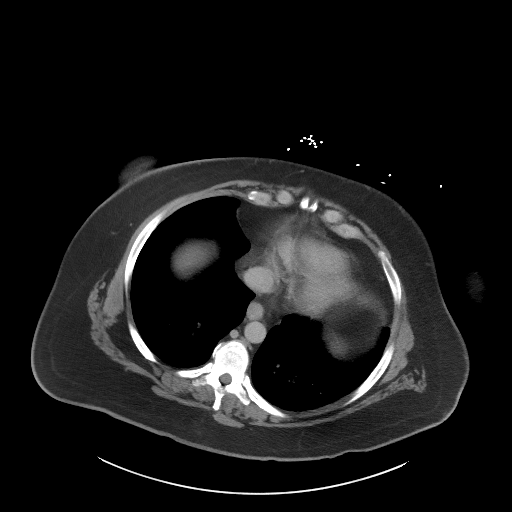

[Series 4: coronal st · coronal · 0.91mm/px · 3 of 100 slices shown]
[im 34/100  soft-tissue]
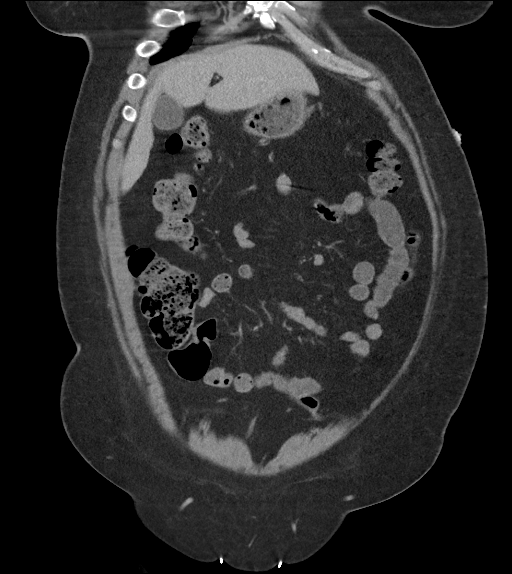
[im 45/100  soft-tissue]
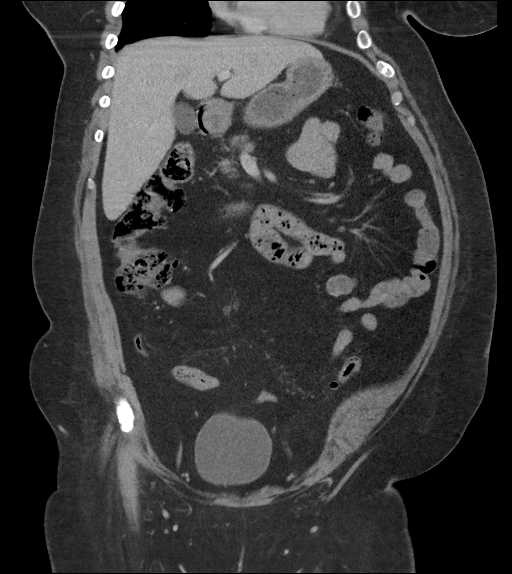
[im 56/100  soft-tissue]
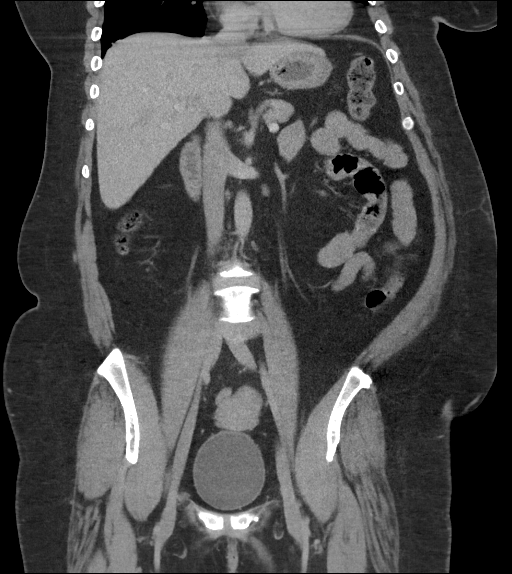

[16 of 46 positions shown; findings below may reference images not displayed]

FINDINGS: Lower chest: No acute abnormality.

Hepatobiliary: No focal liver abnormality is seen. No gallstones,
gallbladder wall thickening, or biliary dilatation.

Pancreas: Unremarkable

Spleen: Unremarkable

Adrenals/Urinary Tract: The adrenal glands are unremarkable. The
kidneys are normal in size and position. 3 mm nonobstructing
calculus noted within the lower pole the left kidney. The kidneys
are otherwise unremarkable. The bladder is unremarkable.

Stomach/Bowel: Stomach is within normal limits. Appendix appears
normal. No evidence of bowel wall thickening, distention, or
inflammatory changes.

Vascular/Lymphatic: No significant vascular findings are present. No
enlarged abdominal or pelvic lymph nodes.

Reproductive: Uterus and bilateral adnexa are unremarkable.

Other: No abdominal wall hernia.  The rectum is unremarkable.

Musculoskeletal: Degenerative changes are seen within the
lumbosacral and thoracolumbar junctions. No acute bone abnormality.
No lytic or blastic bone lesion.
IMPRESSION: No acute intra-abdominal pathology identified. No definite
radiographic explanation for the patient's reported symptoms.

Minimal left nonobstructing nephrolithiasis.
